# Patient Record
Sex: Male | Born: 1945 | Race: White | Hispanic: No | Marital: Married | Smoking: Former smoker
Health system: Southern US, Community
[De-identification: ages and names within clinical notes are randomized; demographics above are authoritative.]

## PROBLEM LIST (undated history)

## (undated) DIAGNOSIS — I2510 Atherosclerotic heart disease of native coronary artery without angina pectoris: Secondary | ICD-10-CM

## (undated) DIAGNOSIS — K219 Gastro-esophageal reflux disease without esophagitis: Secondary | ICD-10-CM

## (undated) DIAGNOSIS — F5221 Male erectile disorder: Secondary | ICD-10-CM

## (undated) DIAGNOSIS — I509 Heart failure, unspecified: Secondary | ICD-10-CM

## (undated) DIAGNOSIS — I10 Essential (primary) hypertension: Secondary | ICD-10-CM

## (undated) DIAGNOSIS — I69320 Aphasia following cerebral infarction: Secondary | ICD-10-CM

## (undated) DIAGNOSIS — S065X9A Traumatic subdural hemorrhage with loss of consciousness of unspecified duration, initial encounter: Secondary | ICD-10-CM

## (undated) DIAGNOSIS — I480 Paroxysmal atrial fibrillation: Secondary | ICD-10-CM

## (undated) DIAGNOSIS — Z950 Presence of cardiac pacemaker: Secondary | ICD-10-CM

## (undated) DIAGNOSIS — E785 Hyperlipidemia, unspecified: Secondary | ICD-10-CM

## (undated) DIAGNOSIS — I639 Cerebral infarction, unspecified: Secondary | ICD-10-CM

## (undated) DIAGNOSIS — S065XAA Traumatic subdural hemorrhage with loss of consciousness status unknown, initial encounter: Secondary | ICD-10-CM

## (undated) DIAGNOSIS — D62 Acute posthemorrhagic anemia: Secondary | ICD-10-CM

## (undated) DIAGNOSIS — Z95818 Presence of other cardiac implants and grafts: Secondary | ICD-10-CM

## (undated) DIAGNOSIS — F418 Other specified anxiety disorders: Secondary | ICD-10-CM

## (undated) DIAGNOSIS — I495 Sick sinus syndrome: Secondary | ICD-10-CM

## (undated) DIAGNOSIS — R531 Weakness: Secondary | ICD-10-CM

## (undated) DIAGNOSIS — Z974 Presence of external hearing-aid: Secondary | ICD-10-CM

## (undated) HISTORY — PX: CATARACT EXTRACTION: SUR2

## (undated) HISTORY — PX: HEMORROIDECTOMY: SUR656

## (undated) HISTORY — PX: CHOLECYSTECTOMY: SHX55

## (undated) HISTORY — PX: UPPER GI ENDOSCOPY: SHX6162

---

## 1994-09-03 DIAGNOSIS — I219 Acute myocardial infarction, unspecified: Secondary | ICD-10-CM

## 1994-09-03 HISTORY — DX: Acute myocardial infarction, unspecified: I21.9

## 2004-10-04 ENCOUNTER — Emergency Department: Payer: Self-pay | Admitting: Emergency Medicine

## 2005-02-08 ENCOUNTER — Other Ambulatory Visit: Payer: Self-pay

## 2005-02-08 ENCOUNTER — Emergency Department: Payer: Self-pay | Admitting: General Practice

## 2005-02-09 ENCOUNTER — Ambulatory Visit: Payer: Self-pay | Admitting: General Practice

## 2005-04-04 ENCOUNTER — Ambulatory Visit: Payer: Self-pay | Admitting: Gastroenterology

## 2005-04-20 ENCOUNTER — Ambulatory Visit: Payer: Self-pay | Admitting: Surgery

## 2005-07-17 ENCOUNTER — Ambulatory Visit: Payer: Self-pay | Admitting: Gastroenterology

## 2006-07-03 ENCOUNTER — Other Ambulatory Visit: Payer: Self-pay

## 2006-07-03 ENCOUNTER — Emergency Department: Payer: Self-pay | Admitting: Internal Medicine

## 2006-08-19 IMAGING — NM NUCLEAR MEDICINE HEPATOHBILIARY INCLUDE GB
1 series · 6 of 6 positions shown · non-contrast
Comparison: none

REASON FOR EXAM: Abdominal pain. If neg US do HIDA with CCK
COMMENTS:

[Series 0: cck hepato · 3.9mm · 3.90mm/px · 6 of 60 frames shown]
[frame 6/60]
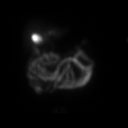
[frame 16/60]
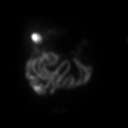
[frame 26/60]
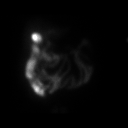
[frame 36/60]
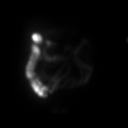
[frame 46/60]
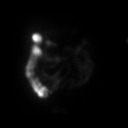
[frame 56/60]
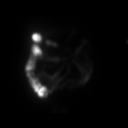

[6 of 6 positions shown; findings below may reference images not displayed]

PROCEDURE:     NM  - NM HEPATO WITH GB EJECT FRACTION  - April 04, 2005 [DATE]

RESULT:     Following injection of 8.11 millicuries of technetium-22m
Choletec, there is noted prompt visualization of tracer activity in the
liver at three minutes. Tracer activity is seen in the gallbladder, common
duct, and proximal small bowel at 30 minutes.

The gallbladder ejection fraction at 30 minutes measures 2% which is in the
hypokinetic range.
IMPRESSION: Normal hepatobiliary scan.

The gallbladder ejection fraction measures 2% which is below the normal
range and consistent with hypokinesis or akinesis.

## 2006-08-27 ENCOUNTER — Emergency Department: Payer: Self-pay | Admitting: Emergency Medicine

## 2006-08-27 ENCOUNTER — Other Ambulatory Visit: Payer: Self-pay

## 2006-11-13 ENCOUNTER — Other Ambulatory Visit: Payer: Self-pay

## 2006-11-13 ENCOUNTER — Emergency Department: Payer: Self-pay | Admitting: Emergency Medicine

## 2007-10-27 ENCOUNTER — Ambulatory Visit: Payer: Self-pay | Admitting: Family Medicine

## 2007-11-17 IMAGING — CR DG CHEST 2V
1 series · 2 of 2 positions shown · non-contrast
Comparison: none

REASON FOR EXAM: Chest pain
COMMENTS:

PROCEDURE:     DXR - DXR CHEST PA (OR AP) AND LATERAL  - July 03, 2006 [DATE]
RESULT:     Comparison is made to a prior study dated 02/08/2005.
The lungs are clear. The cardiac silhouette and visualized bony skeleton are
unremarkable.

[Series 1: view not recorded · 0.17mm/px · 2 of 2 slices shown]
[im 1/2]
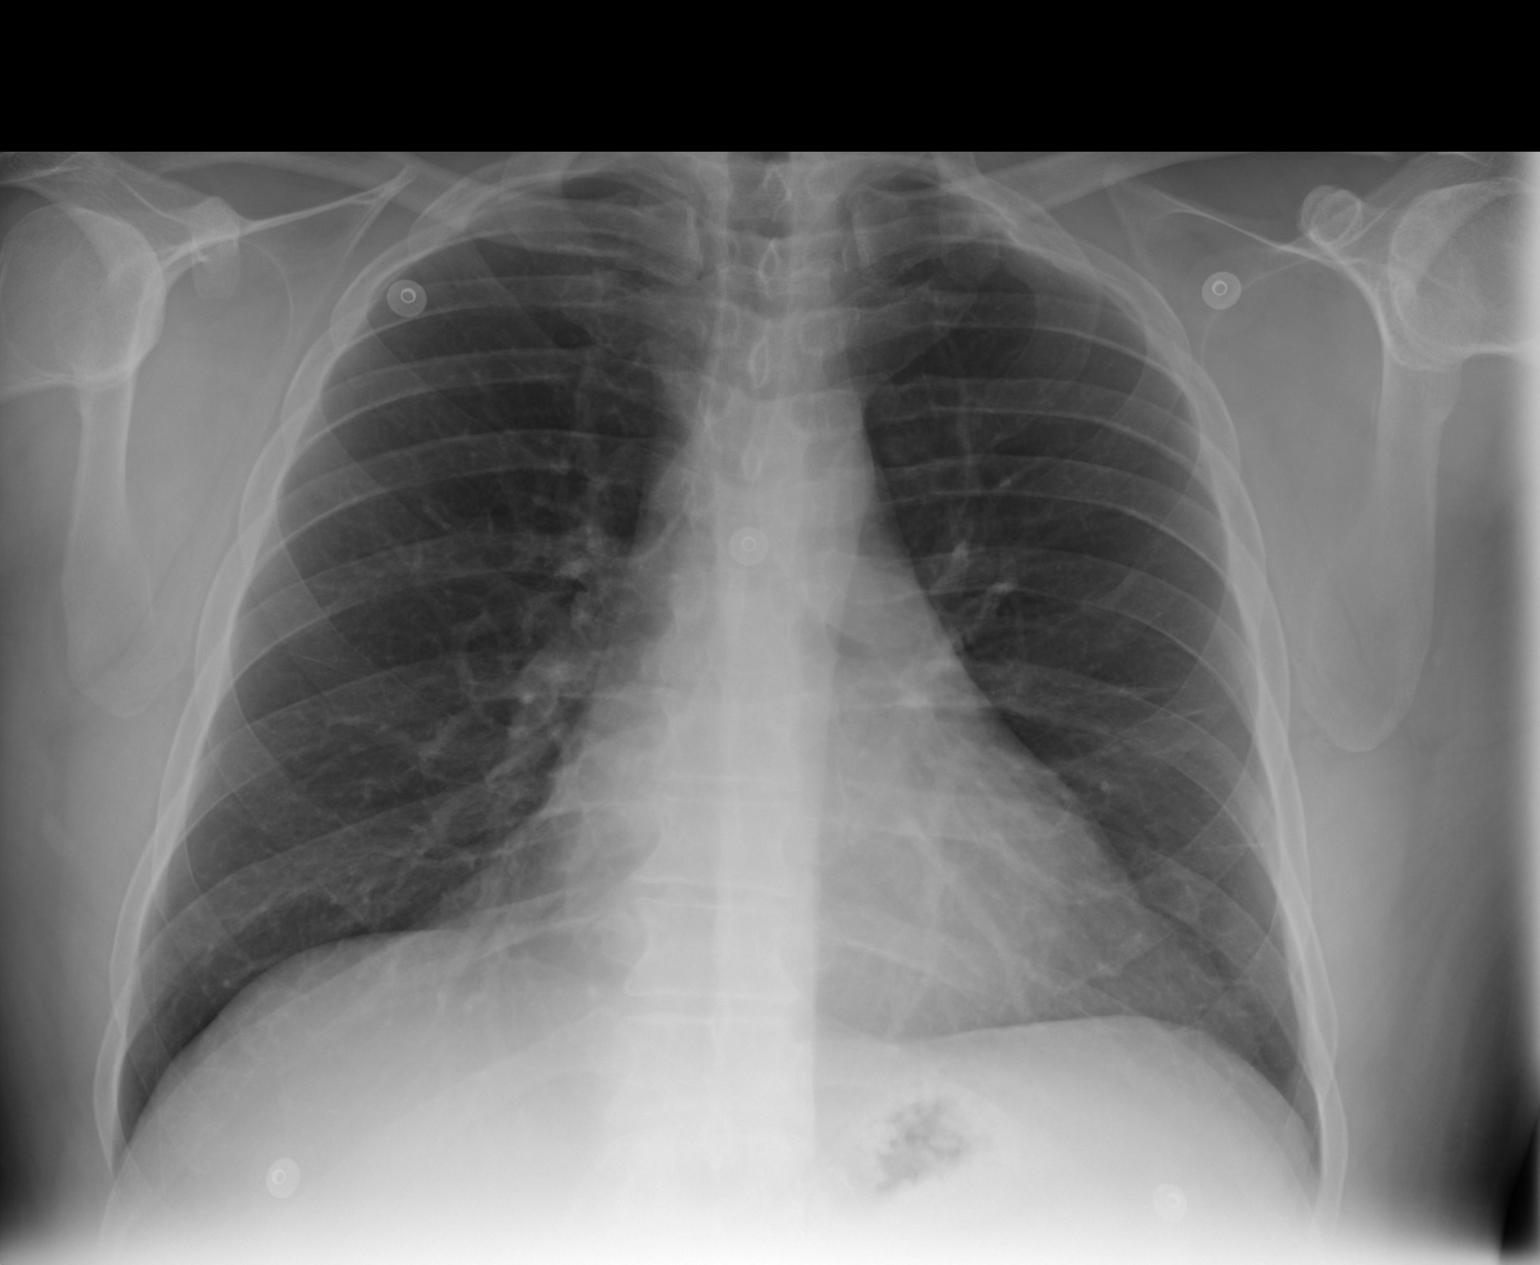
[im 2/2]
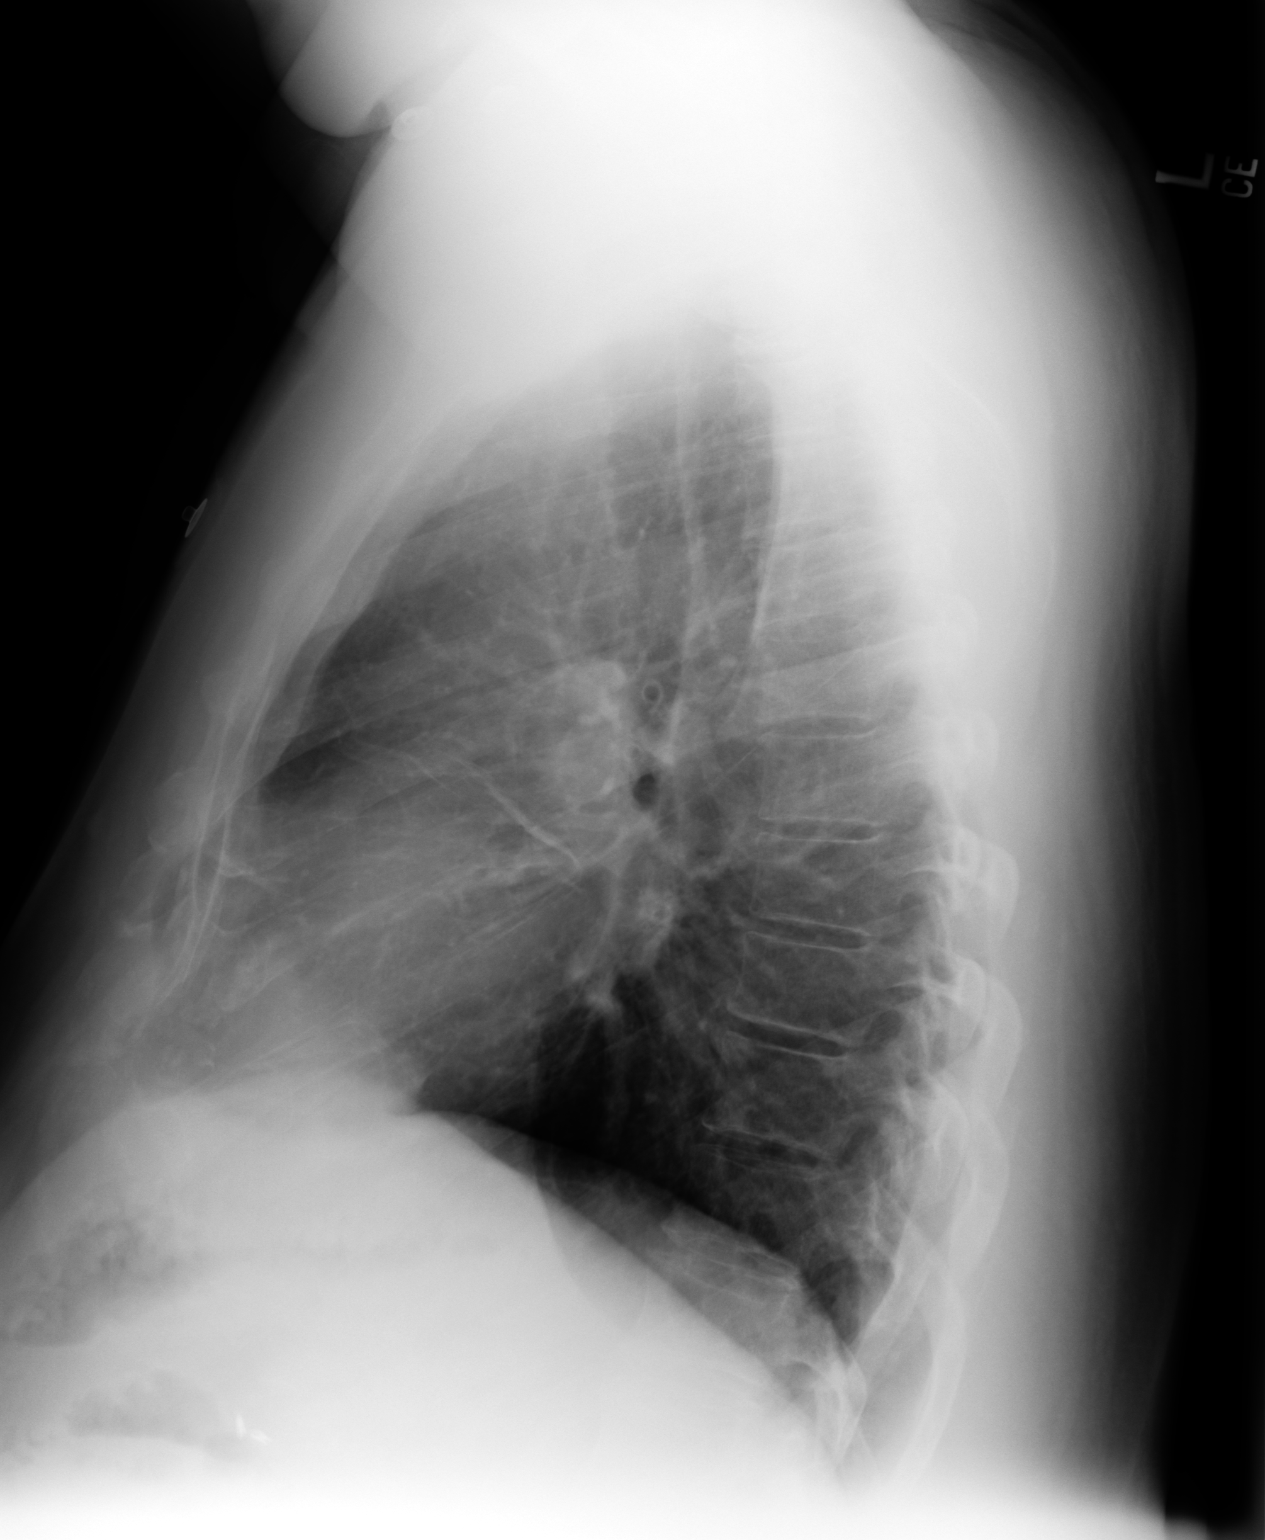

[2 of 2 positions shown; findings below may reference images not displayed]

IMPRESSION: Chest radiograph without evidence of acute cardiopulmonary
disease.

## 2007-11-23 ENCOUNTER — Emergency Department: Payer: Self-pay | Admitting: Emergency Medicine

## 2007-12-25 ENCOUNTER — Emergency Department: Payer: Self-pay | Admitting: Emergency Medicine

## 2007-12-26 ENCOUNTER — Other Ambulatory Visit: Payer: Self-pay

## 2007-12-26 ENCOUNTER — Inpatient Hospital Stay: Payer: Self-pay | Admitting: Internal Medicine

## 2007-12-26 ENCOUNTER — Ambulatory Visit: Payer: Self-pay | Admitting: Family Medicine

## 2008-01-11 IMAGING — CR DG ABDOMEN 3V
1 series · 5 of 5 positions shown · non-contrast
Comparison: none

REASON FOR EXAM: ABDOMINAL PAIN
COMMENTS:

[Series 1: view not recorded · 0.17mm/px · 5 of 5 slices shown]
[im 1/5]
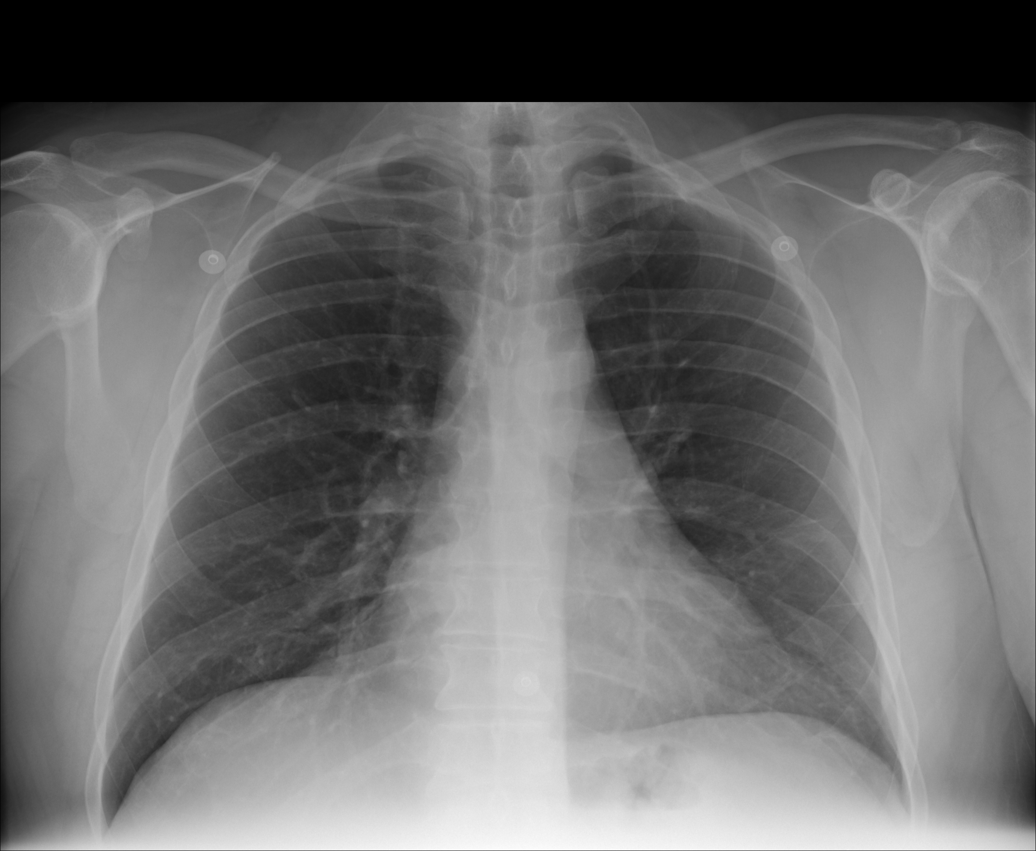
[im 2/5]
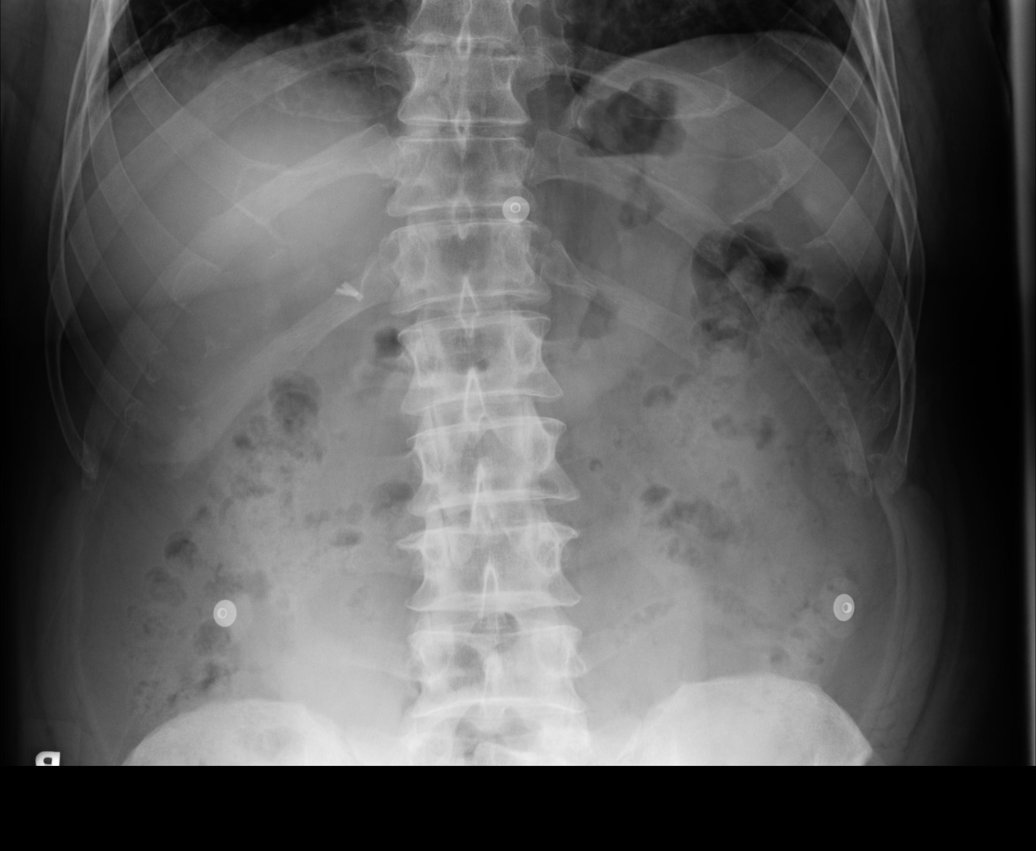
[im 3/5]
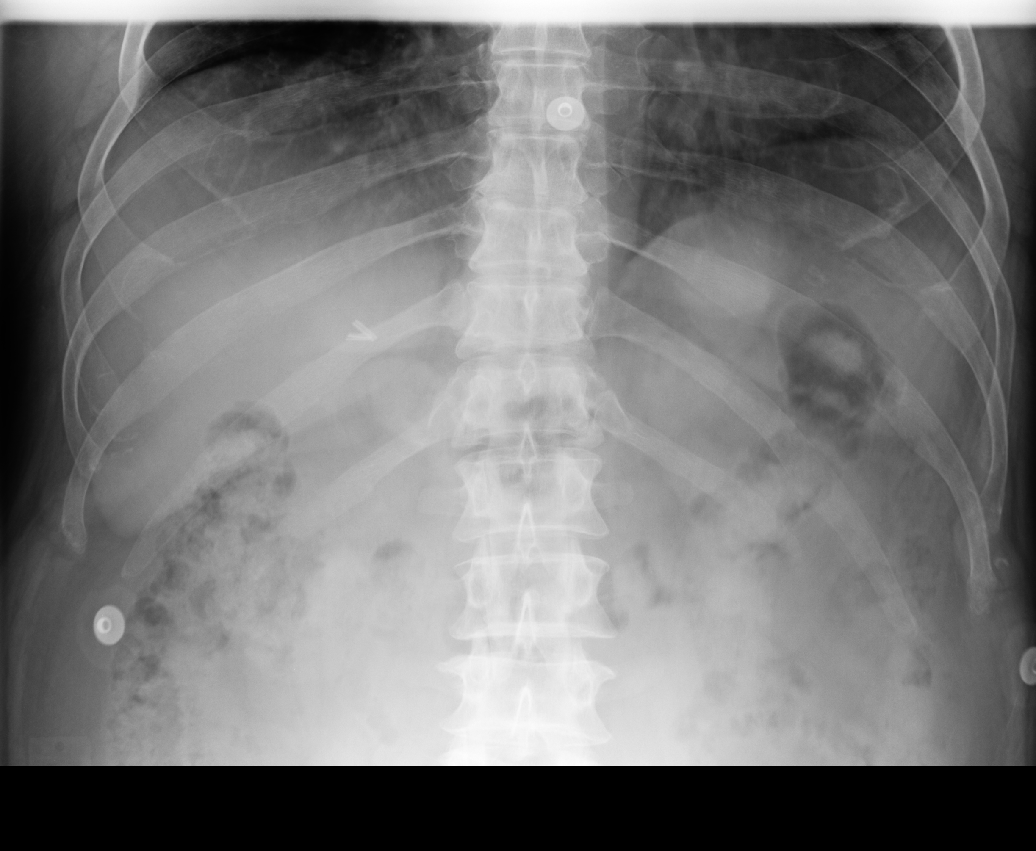
[im 4/5]
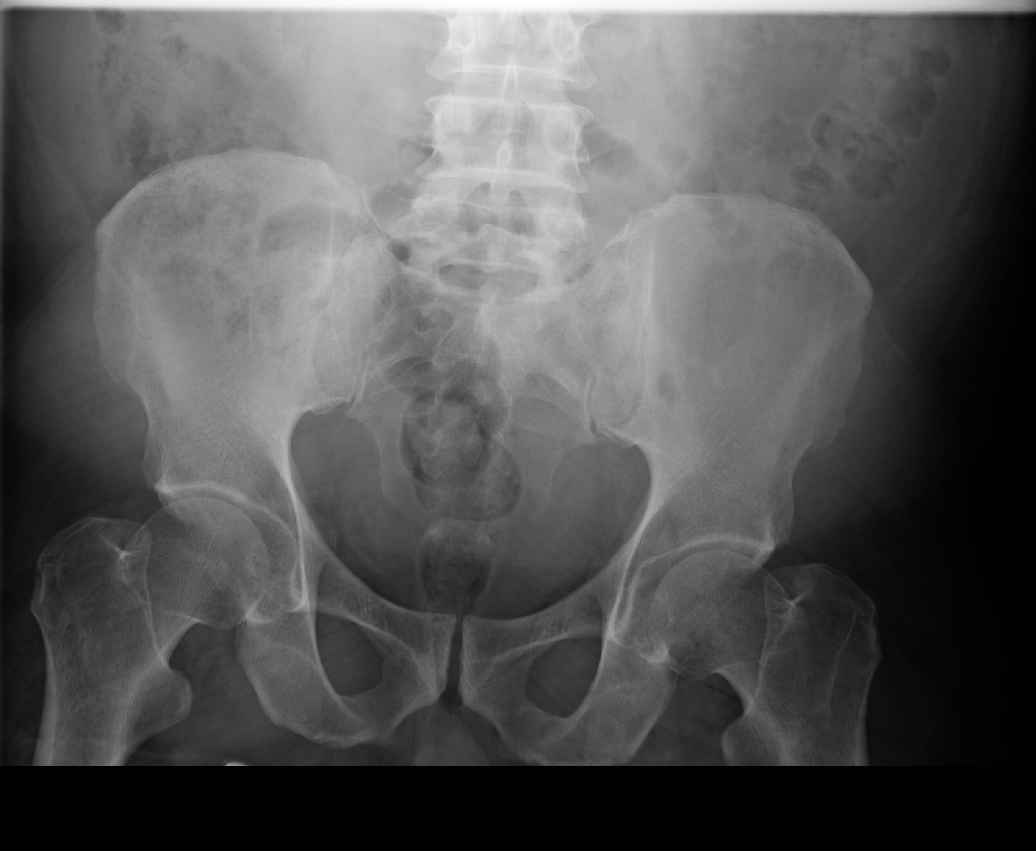
[im 5/5]
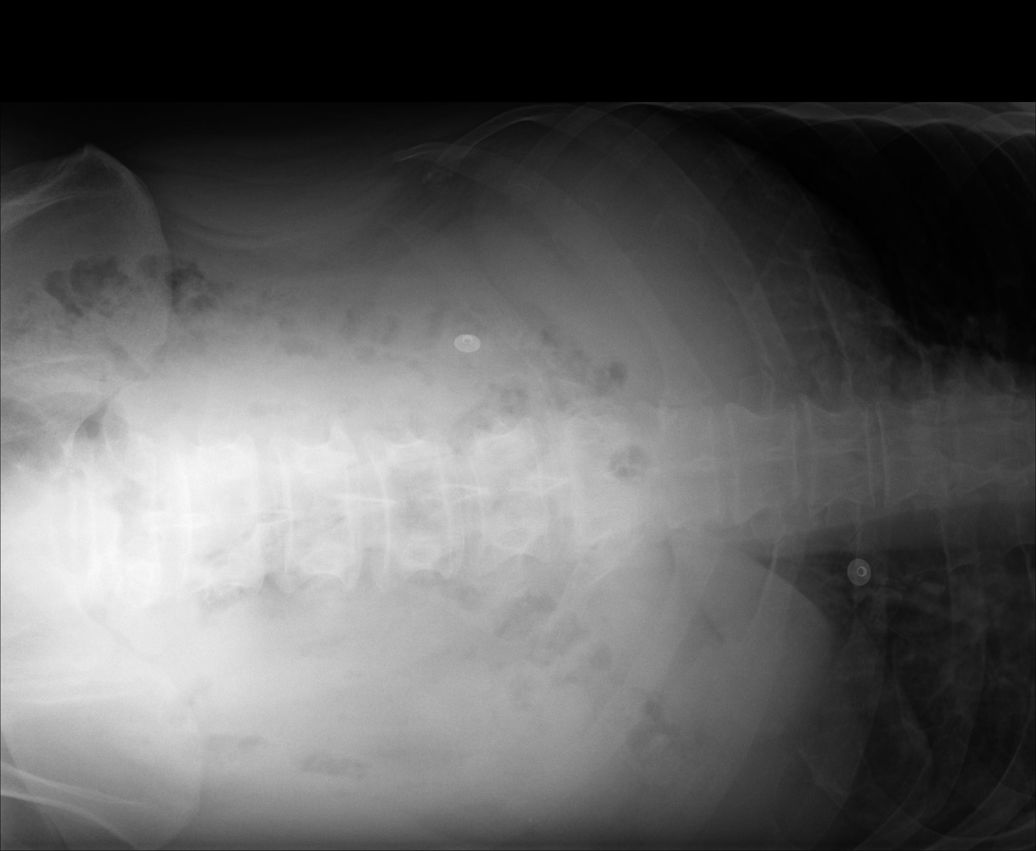

[5 of 5 positions shown; findings below may reference images not displayed]

PROCEDURE:     DXR - DXR ABDOMEN 3-WAY (INCL PA CXR)  - August 27, 2006  [DATE]

RESULT:     The patient is complaining of abdominal discomfort.

The lungs are adequately inflated and clear. The heart and pulmonary
vascularity are normal in appearance. The abdominal films reveal a
relatively nonspecific bowel gas pattern that may indicate constipation.
There is no evidence of obstruction or ileus. The gallbladder is surgically
absent. I see no acute bony abnormality. No free extraluminal gas is seen.
IMPRESSION: 1.     The bowel gas pattern is normal to nonspecific. I cannot exclude
constipation.
2.     I do not see evidence of acute cardiopulmonary disease.

## 2008-02-19 ENCOUNTER — Ambulatory Visit: Payer: Self-pay | Admitting: Unknown Physician Specialty

## 2008-03-29 IMAGING — CR DG CHEST 1V PORT
1 series · 1 of 1 positions shown · non-contrast
Comparison: none

REASON FOR EXAM: Chest Pain
COMMENTS:

PROCEDURE:     DXR - DXR PORTABLE CHEST SINGLE VIEW  - November 13, 2006  [DATE]
RESULT:     The current exam is compared to a prior examination of
07/03/2006. The lung fields are clear. No acute changes of the heart,
mediastinal or osseous structures are noted. Monitoring electrodes are
present.

[view not recorded]
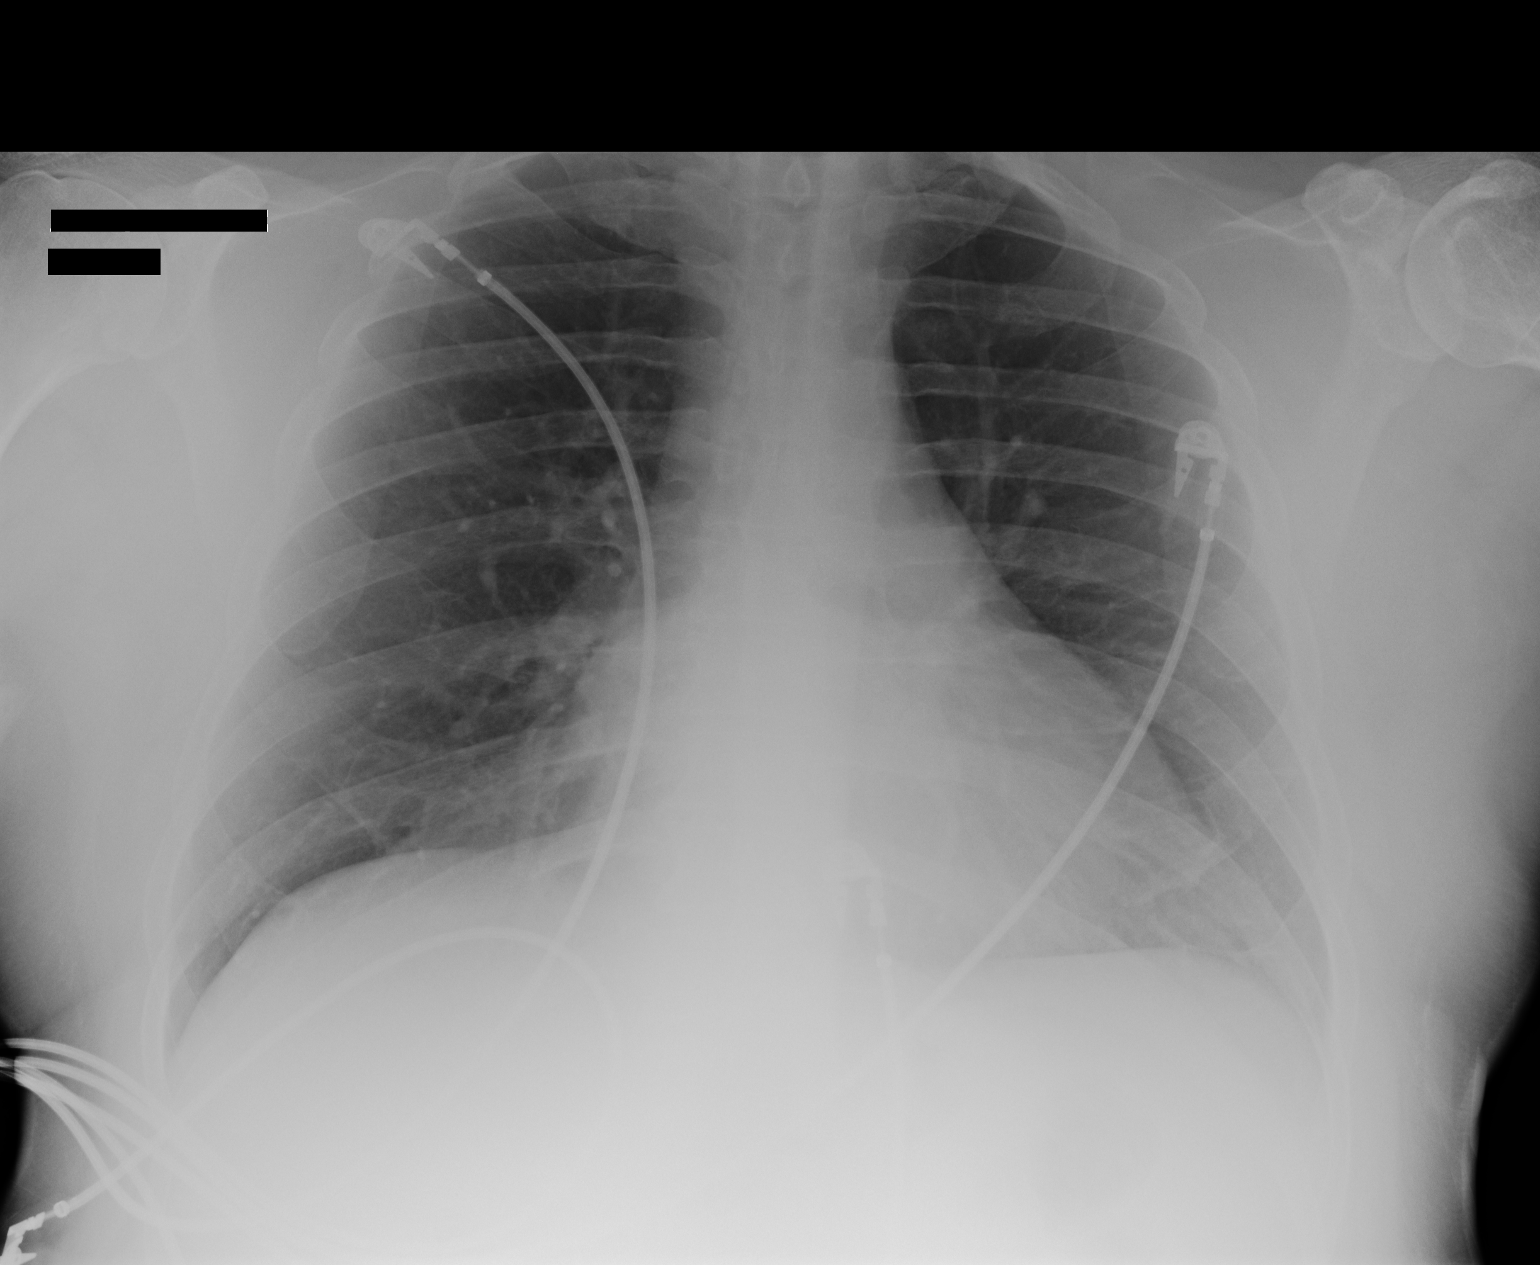

[1 of 1 positions shown; findings below may reference images not displayed]

IMPRESSION: No acute changes are identified.

## 2009-06-26 ENCOUNTER — Ambulatory Visit: Payer: Self-pay | Admitting: Internal Medicine

## 2012-06-11 ENCOUNTER — Ambulatory Visit: Payer: Self-pay | Admitting: Ophthalmology

## 2013-11-07 ENCOUNTER — Ambulatory Visit: Payer: Self-pay | Admitting: Internal Medicine

## 2014-04-14 ENCOUNTER — Ambulatory Visit: Payer: Self-pay | Admitting: Nurse Practitioner

## 2015-08-29 IMAGING — CT CT HEAD WITHOUT CONTRAST
1 series · 15 of 30 positions shown, 19 images · non-contrast
Comparison: None.

CLINICAL DATA: Headache secondary to head trauma secondary to a
fall on 04/13/2014 with trauma to the posterior aspect of the right
side of the skull.

EXAM:
CT HEAD WITHOUT CONTRAST
TECHNIQUE: Contiguous axial images were obtained from the base of the skull
through the vertex without intravenous contrast.

[Series 2: head wo · axial · 0.42mm/px · z∈[-34,+106]mm · 15 of 32 slices shown, 19 images]
[im 2/32  brain]
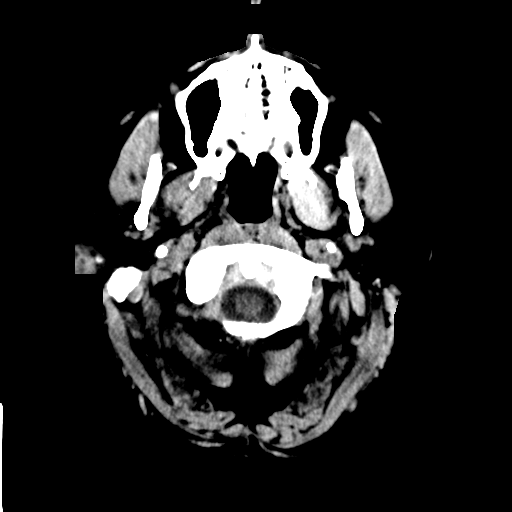
[im 2/32  bone]
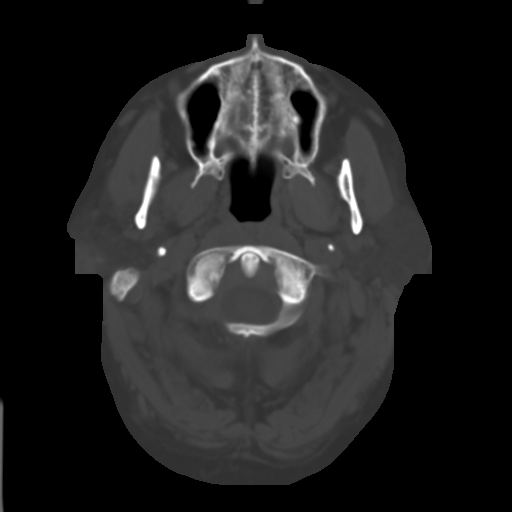
[im 4/32  brain]
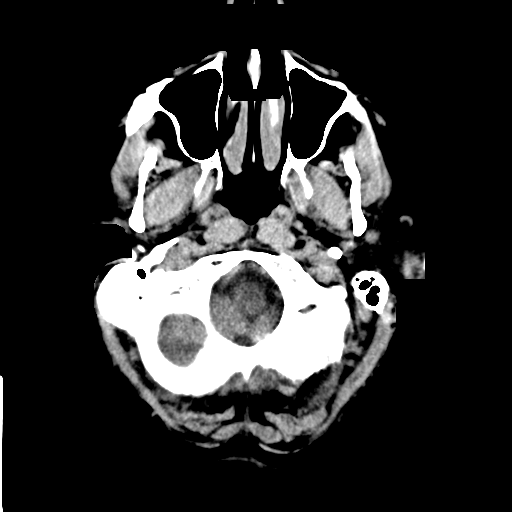
[im 6/32  brain]
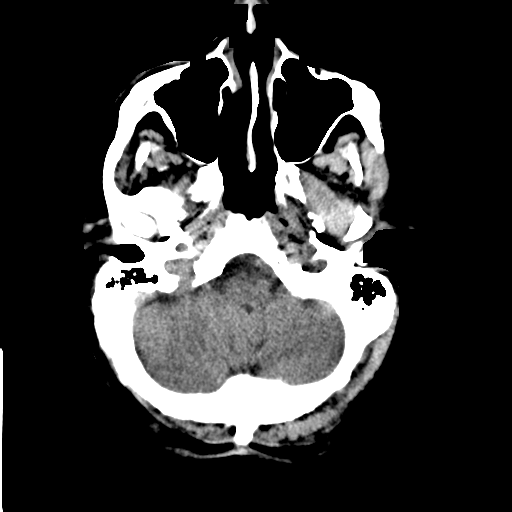
[im 8/32  brain]
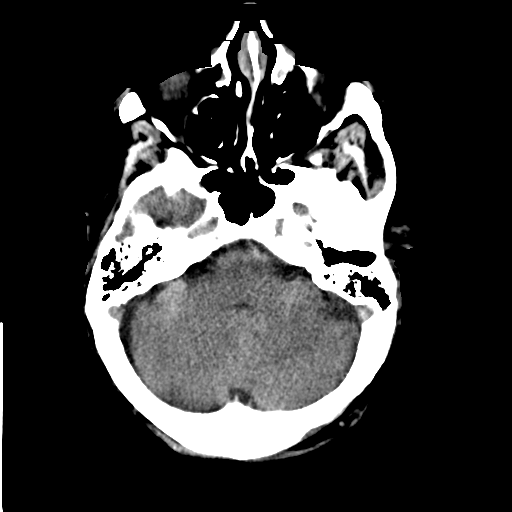
[im 10/32  brain]
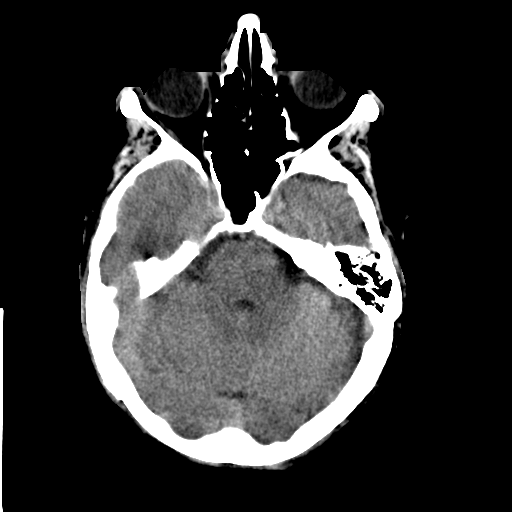
[im 10/32  bone]
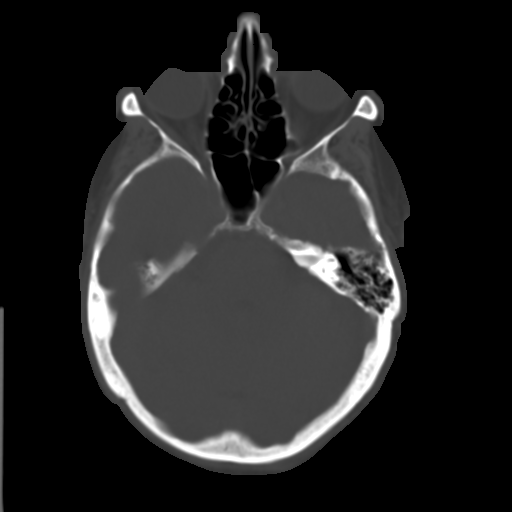
[im 12/32  brain]
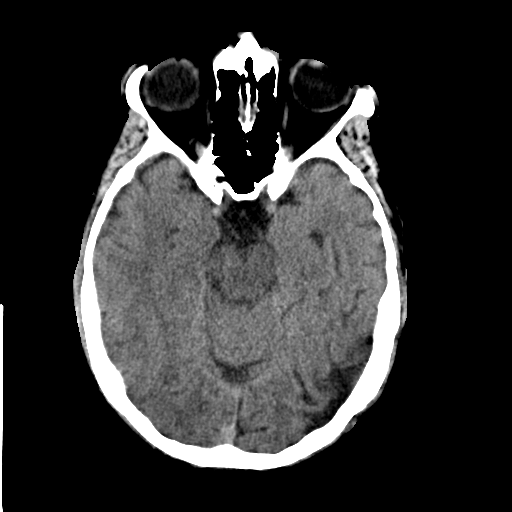
[im 14/32  brain]
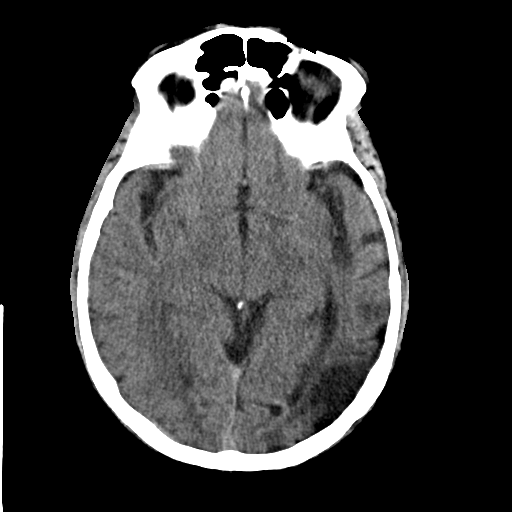
[im 17/32  brain]
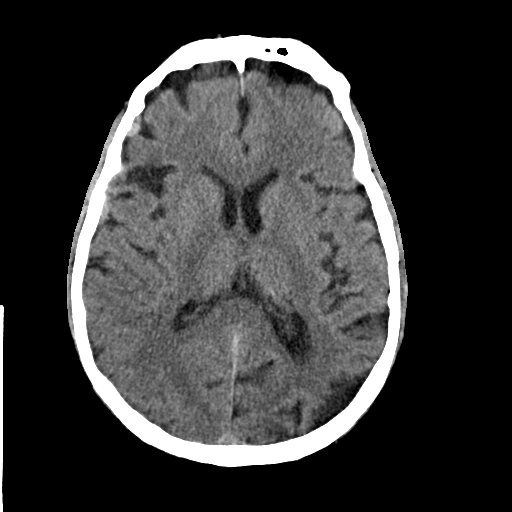
[im 18/32  brain]
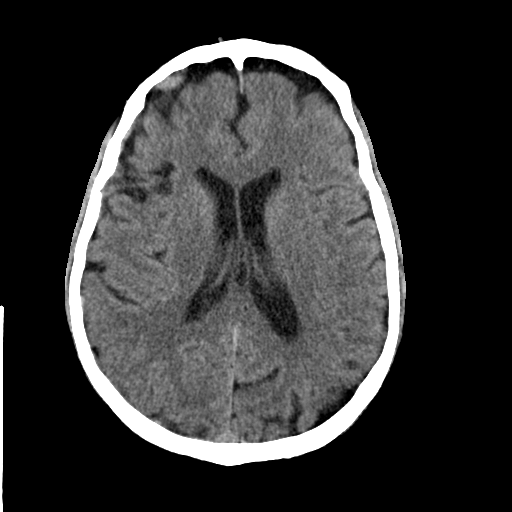
[im 18/32  bone]
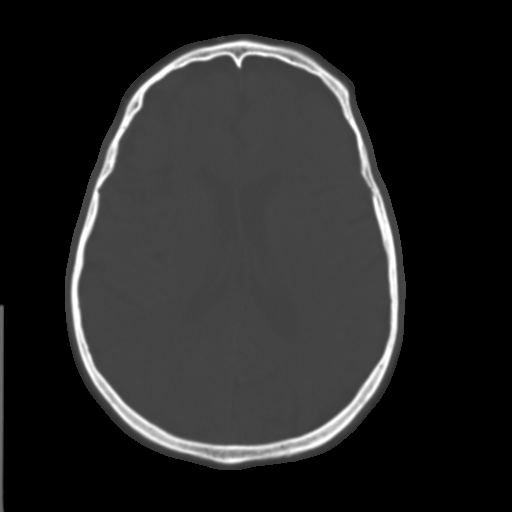
[im 20/32  brain]
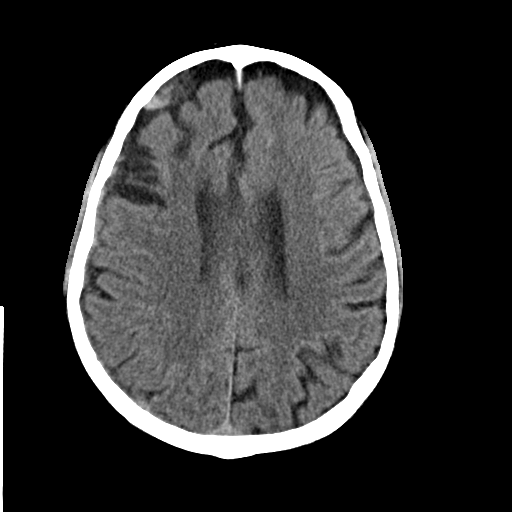
[im 22/32  brain]
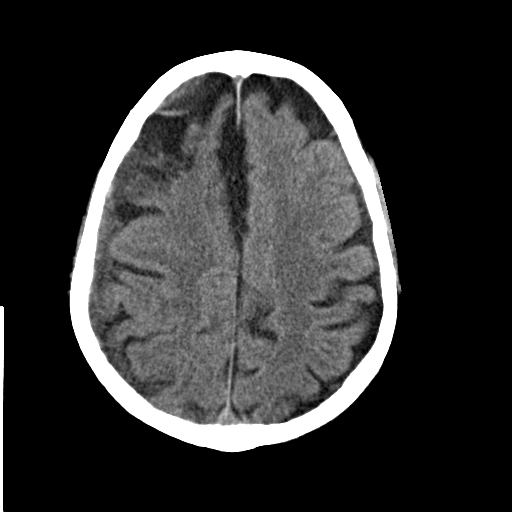
[im 24/32  brain]
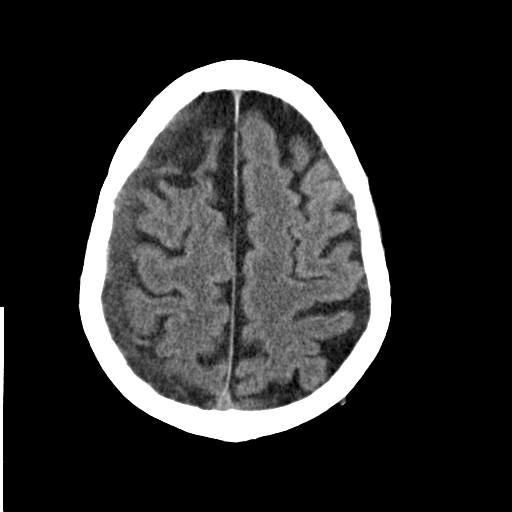
[im 26/32  brain]
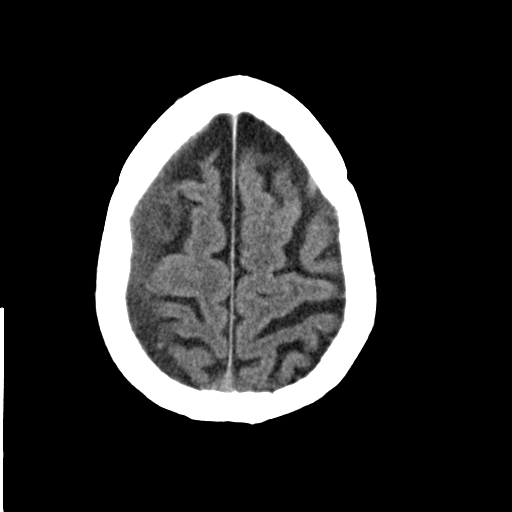
[im 26/32  bone]
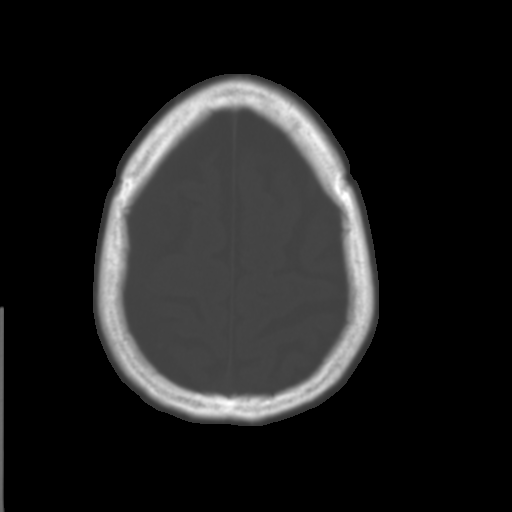
[im 28/32  brain]
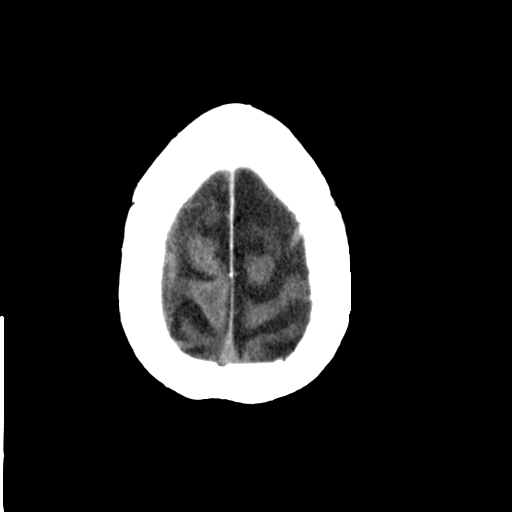
[im 30/32  brain]
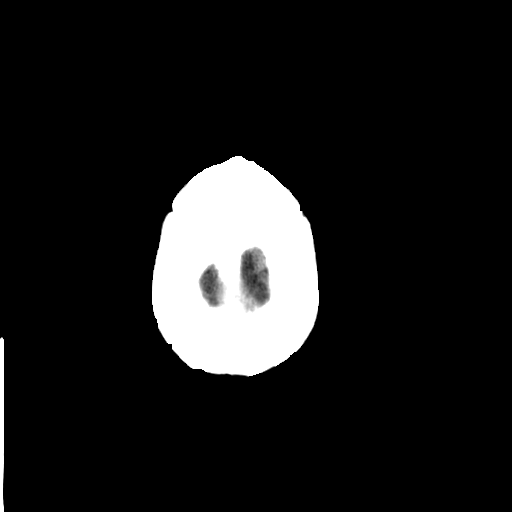

[15 of 30 positions shown; findings below may reference images not displayed]

FINDINGS: The patient has an acute on chronic right subdural hematoma. There
are several small areas of acute hemorrhage within the chronic
subdural hematoma, primarily along the frontal component although
there is some acute hemorrhage in the right temporal fossa and
possibly high over the right frontoparietal region.

There is no midline shift. There is a slight mass effect by the
chronic component of the subdural hematoma over the superior aspect
of the right cerebral hemisphere. The patient has diffuse cerebral
cortical atrophy. There are old right frontal and left occipital
infarcts with secondary encephalomalacia.
IMPRESSION: 1. Acute on chronic right subdural hematoma with slight mass effect
but no midline shift.
2. Diffuse cerebral cortical atrophy.
3. Old left occipital and right frontal infarcts.
Critical Value/emergent results were called by telephone at the time
of interpretation on 04/14/2014 at [DATE] to Tiger, Es
nurse, who verbally acknowledged these results.

## 2015-12-12 ENCOUNTER — Ambulatory Visit
Admission: EM | Admit: 2015-12-12 | Discharge: 2015-12-12 | Disposition: A | Discharge status: Home / Self Care | Payer: Medicare Other | Attending: Family Medicine | Admitting: Family Medicine

## 2015-12-12 ENCOUNTER — Encounter: Payer: Self-pay | Admitting: Gynecology

## 2015-12-12 ENCOUNTER — Ambulatory Visit: Payer: Medicare Other

## 2015-12-12 DIAGNOSIS — Z87891 Personal history of nicotine dependence: Secondary | ICD-10-CM | POA: Insufficient documentation

## 2015-12-12 DIAGNOSIS — Z7982 Long term (current) use of aspirin: Secondary | ICD-10-CM | POA: Insufficient documentation

## 2015-12-12 DIAGNOSIS — Z8673 Personal history of transient ischemic attack (TIA), and cerebral infarction without residual deficits: Secondary | ICD-10-CM | POA: Diagnosis not present

## 2015-12-12 DIAGNOSIS — I10 Essential (primary) hypertension: Secondary | ICD-10-CM | POA: Insufficient documentation

## 2015-12-12 DIAGNOSIS — I480 Paroxysmal atrial fibrillation: Secondary | ICD-10-CM | POA: Insufficient documentation

## 2015-12-12 DIAGNOSIS — F0390 Unspecified dementia without behavioral disturbance: Secondary | ICD-10-CM | POA: Diagnosis not present

## 2015-12-12 DIAGNOSIS — I2510 Atherosclerotic heart disease of native coronary artery without angina pectoris: Secondary | ICD-10-CM | POA: Insufficient documentation

## 2015-12-12 DIAGNOSIS — R51 Headache: Secondary | ICD-10-CM | POA: Diagnosis present

## 2015-12-12 DIAGNOSIS — J111 Influenza due to unidentified influenza virus with other respiratory manifestations: Secondary | ICD-10-CM | POA: Insufficient documentation

## 2015-12-12 DIAGNOSIS — K219 Gastro-esophageal reflux disease without esophagitis: Secondary | ICD-10-CM | POA: Diagnosis not present

## 2015-12-12 DIAGNOSIS — Z79899 Other long term (current) drug therapy: Secondary | ICD-10-CM | POA: Insufficient documentation

## 2015-12-12 DIAGNOSIS — F419 Anxiety disorder, unspecified: Secondary | ICD-10-CM | POA: Insufficient documentation

## 2015-12-12 DIAGNOSIS — R05 Cough: Secondary | ICD-10-CM | POA: Diagnosis present

## 2015-12-12 DIAGNOSIS — E785 Hyperlipidemia, unspecified: Secondary | ICD-10-CM | POA: Insufficient documentation

## 2015-12-12 HISTORY — DX: Essential (primary) hypertension: I10

## 2015-12-12 HISTORY — DX: Sick sinus syndrome: I49.5

## 2015-12-12 HISTORY — DX: Paroxysmal atrial fibrillation: I48.0

## 2015-12-12 HISTORY — DX: Hyperlipidemia, unspecified: E78.5

## 2015-12-12 HISTORY — DX: Atherosclerotic heart disease of native coronary artery without angina pectoris: I25.10

## 2015-12-12 HISTORY — DX: Other specified anxiety disorders: F41.8

## 2015-12-12 HISTORY — DX: Acute posthemorrhagic anemia: D62

## 2015-12-12 HISTORY — DX: Male erectile disorder: F52.21

## 2015-12-12 HISTORY — DX: Gastro-esophageal reflux disease without esophagitis: K21.9

## 2015-12-12 HISTORY — DX: Cerebral infarction, unspecified: I63.9

## 2015-12-12 HISTORY — DX: Traumatic subdural hemorrhage with loss of consciousness status unknown, initial encounter: S06.5XAA

## 2015-12-12 HISTORY — DX: Traumatic subdural hemorrhage with loss of consciousness of unspecified duration, initial encounter: S06.5X9A

## 2015-12-12 HISTORY — DX: Presence of cardiac pacemaker: Z95.0

## 2015-12-12 LAB — RAPID INFLUENZA A&B ANTIGENS
Influenza A (ARMC): NEGATIVE
Influenza B (ARMC): NEGATIVE

## 2015-12-12 MED ORDER — ALBUTEROL SULFATE HFA 108 (90 BASE) MCG/ACT IN AERS: 2.0000 | INHALATION_SPRAY | Freq: Four times a day (QID) | RESPIRATORY_TRACT | Status: DC | PRN

## 2015-12-12 MED ORDER — AZITHROMYCIN 250 MG PO TABS: ORAL_TABLET | ORAL | Status: DC

## 2015-12-12 MED ORDER — BENZONATATE 100 MG PO CAPS: 100.0000 mg | ORAL_CAPSULE | Freq: Three times a day (TID) | ORAL | Status: DC

## 2015-12-12 NOTE — ED Notes (Signed)
Patient c/o x 1 week coughing / sinus problem. Patient concern about Pneunomia s/p 6 months ago.

## 2015-12-12 NOTE — ED Provider Notes (Signed)
CSN: [id number]     Arrival date & time 12/12/15  1707 History   First MD Initiated Contact with Patient 12/12/15 1753    Nurses notes were reviewed. Chief Complaint  Patient presents with  . Cough  . Facial Pain   Patient reports having a cough some facial pain. He has a history of some dementia and altered mental status which we found out later. His history was somewhat confusing to his daughter basically contributed information. There is some confusion as far as what medicine he was allergic to when he could not take which would probably gastric now. Apparently he was in [place] last week when Monday and Tuesday he became sick Wednesday and Thursday he had fever. The cough was productive earlier in the week and the sputum was grayish green but that has become less productive and is now white but he's had more shortness of breath and the cough has become more intense since then as well. He's had his flu vaccination but that was back in October. His daughter works in [place] to help drive home with him and his wife to get him back to [place]. She was concerned because in November he had pneumonia and a similar presentation which finally cleared once they placed him on a Z-Pak. [facility] November. He still has some facial pain with myalgia has gotten better. His grandson was sick when he got up in [place] and sounds like his grandson had the flu. His arm is very concerned he may have pneumonia again. He's had subdural hematomas before strokes he has coronary artery disease A. fib hypertension hyperlipidemia. He's has gallbladder and him was extracted for the past. He is a former smoker. No significant family history available for this visit.    (Consider location/radiation/quality/duration/timing/severity/associated sxs/prior Treatment) Patient is a 70 y.o. male presenting with cough. The history is provided by the patient. No language interpreter was  used.  Cough Cough characteristics:  Productive and non-productive Sputum characteristics:  White and gray Severity:  Moderate Onset quality:  Sudden Timing:  Constant Progression:  Waxing and waning Chronicity:  New Context: upper respiratory infection   Relieved by:  Nothing Worsened by:  Nothing tried Associated symptoms: chills, fever, rhinorrhea, shortness of breath and sinus congestion   Risk factors: no recent infection     Past Medical History  Diagnosis Date  . Acute blood loss anemia   . Mixed anxiety depressive disorder   . Impotence of non-organic origin   . Gastroesophageal reflux disease   . Sinus node dysfunction (HCC)   . Pacemaker   . Subdural hematoma (HCC)   . Stroke (HCC)   . CAD (coronary artery disease)   . Paroxysmal a-fib (HCC)   . Hypertension   . Hyperlipemia    Past Surgical History  Procedure Laterality Date  . Cholecystectomy    . Hemorroidectomy    . Cataract extraction    . Upper gi endoscopy     No family history on file. Social History  Substance Use Topics  . Smoking status: Former Games developermoker  . Smokeless tobacco: None  . Alcohol Use: No    Review of Systems  Unable to perform ROS: Dementia  Constitutional: Positive for fever and chills.  HENT: Positive for rhinorrhea.   Respiratory: Positive for cough and shortness of breath.   All other systems reviewed and are negative.   Allergies  Erythromycin base; Levofloxacin; Lorazepam; and Oxycodone  Home Medications  Prior to Admission medications   Medication Sig Start Date End Date Taking? Authorizing Provider  aspirin 81 MG tablet Take 81 mg by mouth daily.   Yes Historical Provider, MD  FLUoxetine (PROZAC) 40 MG capsule Take 40 mg by mouth daily.   Yes Historical Provider, MD  latanoprost (XALATAN) 0.005 % ophthalmic solution 1 drop at bedtime.   Yes Historical Provider, MD  metoprolol succinate (TOPROL-XL) 100 MG 24 hr tablet Take 100 mg by mouth daily. Take with or  immediately following a meal.   Yes Historical Provider, MD  omeprazole (PRILOSEC) 20 MG capsule Take 20 mg by mouth daily.   Yes Historical Provider, MD  simvastatin (ZOCOR) 20 MG tablet Take 20 mg by mouth daily.   Yes Historical Provider, MD  albuterol (PROVENTIL HFA;VENTOLIN HFA) 108 (90 Base) MCG/ACT inhaler Inhale 2 puffs into the lungs every 6 (six) hours as needed for wheezing or shortness of breath. 12/12/15   Hassan Rowan, MD  azithromycin (ZITHROMAX Z-PAK) 250 MG tablet Take 2 tablets first day and then 1 po a day for 4 days 12/12/15   Hassan Rowan, MD  benzonatate (TESSALON) 100 MG capsule Take 1 capsule (100 mg total) by mouth every 8 (eight) hours. 12/12/15   Hassan Rowan, MD   Meds Ordered and Administered this Visit  Medications - No data to display  BP 142/69 mmHg  Pulse 63  Temp(Src) 98 F (36.7 C) (Oral)  Resp 16  Ht  (1.651 m)  Wt 195 lb (88.451 kg)  BMI 32.45 kg/m2  SpO2 100% No data found.   Physical Exam  Constitutional: He is oriented to person, place, and time. He appears well-developed and well-nourished.  HENT:  Head: Normocephalic and atraumatic.  Eyes: Conjunctivae are normal. Pupils are equal, round, and reactive to light.  Neck: Normal range of motion. Neck supple. No tracheal deviation present.  Cardiovascular: Normal rate and regular rhythm.   Pulmonary/Chest: Effort normal and breath sounds normal.  Musculoskeletal: Normal range of motion. He exhibits no tenderness.  Lymphadenopathy:    He has cervical adenopathy.  Neurological: He is alert and oriented to person, place, and time.  Skin: Skin is warm and dry.  Psychiatric: He has a normal mood and affect.  Vitals reviewed.   ED Course  Procedures (including critical care time)  Labs Review Labs Reviewed  RAPID INFLUENZA A&B ANTIGENS [facility] ONLY)    Imaging Review Dg Chest 2 View  12/12/2015  CLINICAL DATA:  Productive cough, fever and fatigue for 1 week. EXAM: CHEST  2 VIEW COMPARISON:   12/26/2007 FINDINGS: The heart size and mediastinal contours are within normal limits. The pacer wires are in good position without complicating features. Both lungs are clear. The visualized skeletal structures are unremarkable. IMPRESSION: No acute cardiopulmonary findings. Electronically Signed   By: Rudie Meyer M.D.   On: 12/12/2015 19:12     Visual Acuity Review  Right Eye Distance:   Left Eye Distance:   Bilateral Distance:    Right Eye Near:   Left Eye Near:    Bilateral Near:      Results for orders placed or performed during the hospital encounter of 12/12/15  Rapid Influenza A&B Antigens ([facility] only)  Result Value Ref Range   Influenza A ([facility]) NEGATIVE NEGATIVE   Influenza B ([facility]) NEGATIVE NEGATIVE     MDM   1. Bronchitis with influenza    We'll place patient on Zithromax Z-Pak per his request. We'll place on some Tessalon Perles  for the cough and an albuterol inhaler for that reason bronchospasm and his daughter states she's been having. Follow-up PCP if not better in a week.  Note: This dictation was prepared with Dragon dictation along with smaller phrase technology. Any transcriptional errors that result from this process are unintentional.    Hassan Rowan, MD 12/12/15 1956

## 2015-12-12 NOTE — Discharge Instructions (Signed)
Cough, Adult °A cough helps to clear your throat and lungs. A cough may last only 2-3 weeks (acute), or it may last longer than 8 weeks (chronic). Many different things can cause a cough. A cough may be a sign of an illness or another medical condition. °HOME CARE °· Pay attention to any changes in your cough. °· Take medicines only as told by your doctor. °· If you were prescribed an antibiotic medicine, take it as told by your doctor. Do not stop taking it even if you start to feel better. °· Talk with your doctor before you try using a cough medicine. °· Drink enough fluid to keep your pee (urine) clear or pale yellow. °· If the air is dry, use a cold steam vaporizer or humidifier in your home. °· Stay away from things that make you cough at work or at home. °· If your cough is worse at night, try using extra pillows to raise your head up higher while you sleep. °· Do not smoke, and try not to be around smoke. If you need help quitting, ask your doctor. °· Do not have caffeine. °· Do not drink alcohol. °· Rest as needed. °GET HELP IF: °· You have new problems (symptoms). °· You cough up yellow fluid (pus). °· Your cough does not get better after 2-3 weeks, or your cough gets worse. °· Medicine does not help your cough and you are not sleeping well. °· You have pain that gets worse or pain that is not helped with medicine. °· You have a fever. °· You are losing weight and you do not know why. °· You have night sweats. °GET HELP RIGHT AWAY IF: °· You cough up blood. °· You have trouble breathing. °· Your heartbeat is very fast. °  °This information is not intended to replace advice given to you by your health care provider. Make sure you discuss any questions you have with your health care provider. °  °Document Released: 05/03/2011 Document Revised: 05/11/2015 Document Reviewed: 10/27/2014 °Elsevier Interactive Patient Education ©2016 [employer] ° °Upper Respiratory Infection, Adult °Most upper respiratory  infections (URIs) are caused by a virus. A URI affects the nose, throat, and upper air passages. The most common type of URI is often called "the common cold." °HOME CARE  °· Take medicines only as told by your doctor. °· Gargle warm saltwater or take cough drops to comfort your throat as told by your doctor. °· Use a warm mist humidifier or inhale steam from a shower to increase air moisture. This may make it easier to breathe. °· Drink enough fluid to keep your pee (urine) clear or pale yellow. °· Eat soups and other clear broths. °· Have a healthy diet. °· Rest as needed. °· Go back to work when your fever is gone or your doctor says it is okay. °· You may need to stay home longer to avoid giving your URI to others. °· You can also wear a face mask and wash your hands often to prevent spread of the virus. °· Use your inhaler more if you have asthma. °· Do not use any tobacco products, including cigarettes, chewing tobacco, or electronic cigarettes. If you need help quitting, ask your doctor. °GET HELP IF: °· You are getting worse, not better. °· Your symptoms are not helped by medicine. °· You have chills. °· You are getting more short of breath. °· You have brown or red mucus. °· You have yellow or brown discharge from   your nose. °· You have pain in your face, especially when you bend forward. °· You have a fever. °· You have puffy (swollen) neck glands. °· You have pain while swallowing. °· You have white areas in the back of your throat. °GET HELP RIGHT AWAY IF:  °· You have very bad or constant: °· Headache. °· Ear pain. °· Pain in your forehead, behind your eyes, and over your cheekbones (sinus pain). °· Chest pain. °· You have long-lasting (chronic) lung disease and any of the following: °· Wheezing. °· Long-lasting cough. °· Coughing up blood. °· A change in your usual mucus. °· You have a stiff neck. °· You have changes in your: °· Vision. °· Hearing. °· Thinking. °· Mood. °MAKE SURE YOU:  °· Understand  these instructions. °· Will watch your condition. °· Will get help right away if you are not doing well or get worse. °  °This information is not intended to replace advice given to you by your health care provider. Make sure you discuss any questions you have with your health care provider. °  °Document Released: 02/06/2008 Document Revised: 01/04/2015 Document Reviewed: 11/25/2013 °Elsevier Interactive Patient Education ©2016 Elsevier Inc. ° ° °

## 2017-05-13 ENCOUNTER — Ambulatory Visit
Admission: EM | Admit: 2017-05-13 | Discharge: 2017-05-13 | Disposition: A | Discharge status: Home / Self Care | Payer: Medicare Other | Attending: Family Medicine | Admitting: Family Medicine

## 2017-05-13 DIAGNOSIS — R059 Cough, unspecified: Secondary | ICD-10-CM

## 2017-05-13 DIAGNOSIS — R05 Cough: Secondary | ICD-10-CM | POA: Diagnosis not present

## 2017-05-13 MED ORDER — DOXYCYCLINE HYCLATE 100 MG PO TABS: 100.0000 mg | ORAL_TABLET | Freq: Two times a day (BID) | ORAL | 0 refills | Status: DC

## 2017-05-13 NOTE — ED Provider Notes (Signed)
[facility]    CSN: [id number] Arrival date & time: 05/13/17  1013     History   Chief Complaint Chief Complaint  Patient presents with  . Cough    HPI Karl Tapia is a 71 y.o. male.   The history is provided by the patient.  Cough  Associated symptoms: wheezing   Associated symptoms: no headaches   URI  Presenting symptoms: cough and fatigue   Severity:  Moderate Onset quality:  Sudden Duration:  10 days Timing:  Constant Progression:  Worsening Chronicity:  New Relieved by:  Nothing Ineffective treatments:  OTC medications Associated symptoms: wheezing   Associated symptoms: no headaches   Risk factors: being elderly and chronic cardiac disease   Risk factors: no chronic kidney disease, no diabetes mellitus, no immunosuppression, no recent illness, no recent travel and no sick contacts  Chronic respiratory disease: former smoker.     Past Medical History:  Diagnosis Date  . Acute blood loss anemia   . CAD (coronary artery disease)   . Gastroesophageal reflux disease   . Hyperlipemia   . Hypertension   . Impotence of non-organic origin   . Mixed anxiety depressive disorder   . Pacemaker   . Paroxysmal A-fib (HCC)   . Sinus node dysfunction (HCC)   . Stroke (HCC)   . Subdural hematoma (HCC)     There are no active problems to display for this patient.   Past Surgical History:  Procedure Laterality Date  . CATARACT EXTRACTION    . CHOLECYSTECTOMY    . HEMORROIDECTOMY    . UPPER GI ENDOSCOPY         Home Medications    Prior to Admission medications   Medication Sig Start Date End Date Taking? Authorizing Provider  aspirin 81 MG tablet Take 81 mg by mouth daily.   Yes [provider]  FLUoxetine (PROZAC) 40 MG capsule Take 40 mg by mouth daily.   Yes [provider]  latanoprost (XALATAN) 0.005 % ophthalmic solution 1 drop at bedtime.   Yes [provider]  metoprolol succinate (TOPROL-XL)  100 MG 24 hr tablet Take 100 mg by mouth daily. Take with or immediately following a meal.   Yes [provider]  albuterol (PROVENTIL HFA;VENTOLIN HFA) 108 (90 Base) MCG/ACT inhaler Inhale 2 puffs into the lungs every 6 (six) hours as needed for wheezing or shortness of breath. 12/12/15   Hassan Rowan, MD  azithromycin (ZITHROMAX Z-PAK) 250 MG tablet Take 2 tablets first day and then 1 po a day for 4 days 12/12/15   Hassan Rowan, MD  benzonatate (TESSALON) 100 MG capsule Take 1 capsule (100 mg total) by mouth every 8 (eight) hours. 12/12/15   Hassan Rowan, MD  doxycycline (VIBRA-TABS) 100 MG tablet Take 1 tablet (100 mg total) by mouth 2 (two) times daily. 05/13/17   Payton Mccallum, MD  omeprazole (PRILOSEC) 20 MG capsule Take 20 mg by mouth daily.    [provider]  simvastatin (ZOCOR) 20 MG tablet Take 20 mg by mouth daily.    [provider]    Family History History reviewed. No pertinent family history.  Social History Social History  Substance Use Topics  . Smoking status: Former Games developer  . Smokeless tobacco: Never Used  . Alcohol use No     Allergies   Erythromycin base; Levofloxacin; Lorazepam; and Oxycodone   Review of Systems Review of Systems  Constitutional: Positive for fatigue.  Respiratory: Positive for cough and  wheezing.   Neurological: Negative for headaches.     Physical Exam Triage Vital Signs ED Triage Vitals  Enc Vitals Group     BP 05/13/17 1049 133/77     Pulse Rate 05/13/17 1049 71     Resp 05/13/17 1049 18     Temp 05/13/17 1049 98.4 F (36.9 C)     Temp Source 05/13/17 1049 Oral     SpO2 05/13/17 1049 98 %     Weight 05/13/17 1048 200 lb (90.7 kg)     Height 05/13/17 1048 5' 5.5" (1.664 m)     Head Circumference --      Peak Flow --      Pain Score 05/13/17 1049 0     Pain Loc --      Pain Edu? --      Excl. in GC? --    No data found.   Updated Vital Signs BP 133/77 (BP Location: Left Arm)   Pulse 71   Temp  98.4 F (36.9 C) (Oral)   Resp 18   Ht 5' 5.5" (1.664 m)   Wt 200 lb (90.7 kg)   SpO2 98%   BMI 32.78 kg/m   Visual Acuity Right Eye Distance:   Left Eye Distance:   Bilateral Distance:    Right Eye Near:   Left Eye Near:    Bilateral Near:     Physical Exam  Constitutional: He appears well-developed and well-nourished. No distress.  HENT:  Head: Normocephalic and atraumatic.  Right Ear: Tympanic membrane, external ear and ear canal normal.  Left Ear: Tympanic membrane, external ear and ear canal normal.  Nose: Nose normal.  Mouth/Throat: Uvula is midline, oropharynx is clear and moist and mucous membranes are normal. No oropharyngeal exudate or tonsillar abscesses.  Eyes: Pupils are equal, round, and reactive to light. Conjunctivae and EOM are normal. Right eye exhibits no discharge. Left eye exhibits no discharge. No scleral icterus.  Neck: Normal range of motion. Neck supple. No tracheal deviation present. No thyromegaly present.  Cardiovascular: Normal rate, regular rhythm and normal heart sounds.   Pulmonary/Chest: Effort normal. No stridor. No respiratory distress. He has no wheezes. He has rales (right base). He exhibits no tenderness.  Lymphadenopathy:    He has no cervical adenopathy.  Neurological: He is alert.  Skin: Skin is warm and dry. No rash noted. He is not diaphoretic.  Nursing note and vitals reviewed.    UC Treatments / Results  Labs (all labs ordered are listed, but only abnormal results are displayed) Labs Reviewed - No data to display  EKG  EKG Interpretation None       Radiology No results found.  Procedures Procedures (including critical care time)  Medications Ordered in UC Medications - No data to display   Initial Impression / Assessment and Plan / UC Course  I have reviewed the triage vital signs and the nursing notes.  Pertinent labs & imaging results that were available during my care of the patient were reviewed by me and  considered in my medical decision making (see chart for details).       Final Clinical Impressions(s) / UC Diagnoses   Final diagnoses:  Cough    New Prescriptions Discharge Medication List as of 05/13/2017 12:02 PM    START taking these medications   Details  doxycycline (VIBRA-TABS) 100 MG tablet Take 1 tablet (100 mg total) by mouth 2 (two) times daily., Starting Mon 05/13/2017, Normal  1. diagnosis reviewed with patient 2. rx as per orders above; reviewed possible side effects, interactions, risks and benefits  3. Recommend supportive treatment with rest, fluids, otc cough meds prn 4. Follow-up prn if symptoms worsen or don't improve Controlled Substance Prescriptions [place] Controlled Substance Registry consulted? Not Applicable   Payton Mccallum, MD 05/13/17 1235

## 2017-05-13 NOTE — ED Triage Notes (Signed)
Patient complains of cough and congestion x 10 days. Patient wife reports that he has had pneumonia before.

## 2018-07-14 ENCOUNTER — Other Ambulatory Visit: Payer: Self-pay | Admitting: Physician Assistant

## 2018-07-14 DIAGNOSIS — H9202 Otalgia, left ear: Secondary | ICD-10-CM

## 2018-07-16 ENCOUNTER — Encounter (INDEPENDENT_AMBULATORY_CARE_PROVIDER_SITE_OTHER): Payer: Self-pay

## 2018-07-16 ENCOUNTER — Ambulatory Visit
Admission: RE | Admit: 2018-07-16 | Discharge: 2018-07-16 | Disposition: A | Discharge status: Home / Self Care | Payer: Medicare Other | Source: Ambulatory Visit | Attending: Physician Assistant | Admitting: Physician Assistant

## 2018-07-16 ENCOUNTER — Other Ambulatory Visit
Admission: RE | Admit: 2018-07-16 | Discharge: 2018-07-16 | Disposition: A | Discharge status: Home / Self Care | Payer: Medicare Other | Source: Ambulatory Visit | Attending: Physician Assistant | Admitting: Physician Assistant

## 2018-07-16 DIAGNOSIS — H9202 Otalgia, left ear: Secondary | ICD-10-CM | POA: Diagnosis not present

## 2018-07-16 DIAGNOSIS — R590 Localized enlarged lymph nodes: Secondary | ICD-10-CM | POA: Diagnosis not present

## 2018-07-16 HISTORY — DX: Heart failure, unspecified: I50.9

## 2018-07-16 LAB — CREATININE, SERUM
CREATININE: 1.23 mg/dL (ref 0.61–1.24)
GFR calc non Af Amer: 57 mL/min — ABNORMAL LOW (ref >60)

## 2018-07-16 LAB — BUN: BUN: 13 mg/dL (ref 8–23)

## 2018-07-16 MED ORDER — IOHEXOL 300 MG/ML  SOLN
75.0000 mL | Freq: Once | INTRAMUSCULAR | Status: AC | PRN
  Administered 2018-07-16: 75 mL via INTRAVENOUS

## 2021-02-22 ENCOUNTER — Ambulatory Visit
Admission: EM | Admit: 2021-02-22 | Discharge: 2021-02-22 | Disposition: A | Discharge status: Home / Self Care | Payer: Medicare Other

## 2021-02-22 ENCOUNTER — Encounter: Payer: Self-pay | Admitting: Emergency Medicine

## 2021-02-22 ENCOUNTER — Other Ambulatory Visit: Payer: Self-pay

## 2021-02-22 DIAGNOSIS — W57XXXA Bitten or stung by nonvenomous insect and other nonvenomous arthropods, initial encounter: Secondary | ICD-10-CM

## 2021-02-22 DIAGNOSIS — L299 Pruritus, unspecified: Secondary | ICD-10-CM

## 2021-02-22 DIAGNOSIS — S20462A Insect bite (nonvenomous) of left back wall of thorax, initial encounter: Secondary | ICD-10-CM | POA: Diagnosis not present

## 2021-02-22 NOTE — ED Provider Notes (Signed)
[facility]    CSN: [id number] Arrival date & time: 02/22/21  1191      History   Chief Complaint Chief Complaint  Patient presents with   Back Pain    Left shoulder blade    HPI Keithon Mccoin is a 75 y.o. male.   HPI  75 year old male here for evaluation of itching.  Patient is here with his wife and is complaining of itching to his left shoulder blade that is been present for the last 3 days.  He initially told triage that he was having pain as well but he says he is not really having any pain just itching.  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  Patient's wife reports that she did not see any rash or bites when he told her about the itching yesterday.  She did use a topical counter irritant from [place] which seemed to help the symptoms last night.  Past Medical History:  Diagnosis Date   Acute blood loss anemia    CAD (coronary artery disease)    CHF (congestive heart failure) (HCC)    Gastroesophageal reflux disease    Hyperlipemia    Hypertension    Impotence of non-organic origin    Mixed anxiety depressive disorder    Pacemaker    Paroxysmal A-fib (HCC)    Sinus node dysfunction (HCC)    Stroke (HCC)    Subdural hematoma (HCC)     There are no problems to display for this patient.   Past Surgical History:  Procedure Laterality Date   CATARACT EXTRACTION     CHOLECYSTECTOMY     HEMORROIDECTOMY     UPPER GI ENDOSCOPY         Home Medications    Prior to Admission medications   Medication Sig Start Date End Date Taking? Authorizing Provider  albuterol (PROVENTIL HFA;VENTOLIN HFA) 108 (90 Base) MCG/ACT inhaler Inhale 2 puffs into the lungs every 6 (six) hours as needed for wheezing or shortness of breath. 12/12/15  Yes Hassan Rowan, MD  aspirin 81 MG tablet Take 81 mg by mouth daily.   Yes [provider]  atorvastatin (LIPITOR) 80 MG tablet Take 1 tablet by mouth daily.  01/24/21  Yes [provider]  clopidogrel (PLAVIX) 75 MG tablet Take 1 tablet by mouth daily. 12/29/20  Yes [provider]  FLUoxetine (PROZAC) 10 MG tablet Take 10 mg by mouth daily. 12/18/20  Yes [provider]  latanoprost (XALATAN) 0.005 % ophthalmic solution 1 drop at bedtime.   Yes [provider]  lisinopril (ZESTRIL) 10 MG tablet Take by mouth. 12/18/20  Yes [provider]  metoprolol succinate (TOPROL-XL) 100 MG 24 hr tablet Take 100 mg by mouth daily. Take with or immediately following a meal.   Yes [provider]  omeprazole (PRILOSEC) 20 MG capsule Take 20 mg by mouth daily.   Yes [provider]    Family History History reviewed. No pertinent family history.  Social History Social History   Tobacco Use   Smoking status: Former    Pack years: 0.00   Smokeless tobacco: Never  Vaping Use   Vaping Use: Never used  Substance Use Topics   Alcohol use: No   Drug use: No     Allergies   Erythromycin base, Levofloxacin, Lorazepam, and Oxycodone   Review of Systems Review of Systems  Constitutional:  Negative for activity change, appetite change and fever.  Skin:  Negative for color change and rash.    Physical Exam Triage Vital Signs ED Triage Vitals [02/22/21 0946]  Enc Vitals Group     BP      Pulse      Resp      Temp      Temp src      SpO2      Weight      Height      Head Circumference      Peak Flow      Pain Score 5     Pain Loc      Pain Edu?      Excl. in GC?    No data found.  Updated Vital Signs BP 111/69 (BP Location: Left Arm)   Pulse 63   Temp 98 F (36.7 C) (Oral)   Resp 18   Ht 5' 5.5" (1.664 m)   Wt 172 lb (78 kg)   SpO2 100%   BMI 28.19 kg/m   Visual Acuity Right Eye Distance:   Left Eye Distance:   Bilateral Distance:    Right Eye Near:   Left Eye Near:    Bilateral Near:     Physical Exam Vitals and nursing note reviewed.  Constitutional:       General: He is not in acute distress.    Appearance: Normal appearance. He is normal weight. He is not ill-appearing.  HENT:     Head: Normocephalic and atraumatic.  Skin:    General: Skin is warm and dry.     Capillary Refill: Capillary refill takes less than 2 seconds.     Findings: Erythema present.  Neurological:     General: No focal deficit present.     Mental Status: He is alert and oriented to person, place, and time.  Psychiatric:        Mood and Affect: Mood normal.        Behavior: Behavior normal.        Thought Content: Thought content normal.        Judgment: Judgment normal.     UC Treatments / Results  Labs (all labs ordered are listed, but only abnormal results are displayed) Labs Reviewed - No data to display  EKG   Radiology No results found.  Procedures Procedures (including critical care time)  Medications Ordered in UC Medications - No data to display  Initial Impression / Assessment and Plan / UC Course  I have reviewed the triage vital signs and the nursing notes.  Pertinent labs & imaging results that were available during my care of the patient were reviewed by me and considered in my medical decision making (see chart for details).  Patient is a very pleasant, nontoxic-appearing 75 year old male here for evaluation of itching to his left shoulder blade as outlined in HPI above.  Patient's physical exam reveals a small, pencil head eraser sized area of erythema just below with the patient indicates he is having itching.  When viewed under magnification there are 2 small red dots which could represent an insect bite.  The area is not indurated, hot, erythematous, tender, or edematous.  We will treat patient conservatively with over-the-counter Allegra 180 mg daily to help with itching and topical Benadryl as needed.  Patient does have a significant amount of nevi and dilated pores of Wiener across his back.  I have suggested to patient and his spouse  that he see dermatology at least once a year for a skin survey  and they are in agreement.   Final Clinical Impressions(s) / UC Diagnoses   Final diagnoses:  Itching  Insect bite of left back wall of thorax, initial encounter     Discharge Instructions      Take OTC Allegra 180 mg daily to help with itching.  You can apply topical Benadryl cream as well to help with itching.     ED Prescriptions   None    PDMP not reviewed this encounter.   Becky Augusta, NP 02/22/21 1012

## 2021-02-22 NOTE — ED Triage Notes (Addendum)
Pt c/o pain and itching around the left shoulder blade for several days. Pt denies any known injuries or any heavy lifting. Pt denies any radiating pain. Pt does have full ROM. There is no rash or other skin problem present in the area. Pt does report there may have been a rash in the area a couple weeks ago.

## 2021-02-22 NOTE — Discharge Instructions (Addendum)
Take OTC Allegra 180 mg daily to help with itching.  You can apply topical Benadryl cream as well to help with itching.

## 2021-06-19 ENCOUNTER — Encounter: Payer: Self-pay | Admitting: Ophthalmology

## 2021-06-27 NOTE — Discharge Instructions (Signed)

## 2021-06-28 ENCOUNTER — Other Ambulatory Visit: Payer: Self-pay

## 2021-06-28 ENCOUNTER — Ambulatory Visit
Admission: RE | Admit: 2021-06-28 | Discharge: 2021-06-28 | Disposition: A | Discharge status: Home / Self Care | Payer: Medicare Other | Attending: Ophthalmology | Admitting: Ophthalmology

## 2021-06-28 ENCOUNTER — Encounter: Payer: Self-pay | Admitting: Ophthalmology

## 2021-06-28 ENCOUNTER — Encounter
Admission: RE | Disposition: A | Discharge status: Home / Self Care | Payer: Self-pay | Source: Home / Self Care | Attending: Ophthalmology

## 2021-06-28 ENCOUNTER — Ambulatory Visit: Payer: Medicare Other | Admitting: Anesthesiology

## 2021-06-28 DIAGNOSIS — Z87891 Personal history of nicotine dependence: Secondary | ICD-10-CM | POA: Diagnosis not present

## 2021-06-28 DIAGNOSIS — H401121 Primary open-angle glaucoma, left eye, mild stage: Secondary | ICD-10-CM | POA: Diagnosis not present

## 2021-06-28 DIAGNOSIS — H2512 Age-related nuclear cataract, left eye: Secondary | ICD-10-CM | POA: Insufficient documentation

## 2021-06-28 HISTORY — DX: Presence of external hearing-aid: Z97.4

## 2021-06-28 HISTORY — DX: Presence of other cardiac implants and grafts: Z95.818

## 2021-06-28 HISTORY — DX: Aphasia following cerebral infarction: I69.320

## 2021-06-28 HISTORY — PX: CATARACT EXTRACTION W/PHACO: SHX586

## 2021-06-28 SURGERY — PHACOEMULSIFICATION, CATARACT, WITH IOL INSERTION
Anesthesia: Monitor Anesthesia Care | Site: Eye | Laterality: Left

## 2021-06-28 MED ORDER — SIGHTPATH DOSE#1 BSS IO SOLN
INTRAOCULAR | Status: DC | PRN
  Administered 2021-06-28: 68 mL via OPHTHALMIC

## 2021-06-28 MED ORDER — LACTATED RINGERS IV SOLN: INTRAVENOUS | Status: DC

## 2021-06-28 MED ORDER — DEXMEDETOMIDINE (PRECEDEX) IN NS 20 MCG/5ML (4 MCG/ML) IV SYRINGE
PREFILLED_SYRINGE | INTRAVENOUS | Status: DC | PRN
  Administered 2021-06-28: 5 ug via INTRAVENOUS

## 2021-06-28 MED ORDER — ARMC OPHTHALMIC DILATING DROPS
1.0000 "application " | OPHTHALMIC | Status: DC | PRN
  Administered 2021-06-28 (×3): 1 via OPHTHALMIC

## 2021-06-28 MED ORDER — SIGHTPATH DOSE#1 NA HYALUR & NA CHOND-NA HYALUR IO KIT
PACK | INTRAOCULAR | Status: DC | PRN
  Administered 2021-06-28 (×2): 1 via OPHTHALMIC

## 2021-06-28 MED ORDER — TETRACAINE HCL 0.5 % OP SOLN
1.0000 [drp] | OPHTHALMIC | Status: DC | PRN
  Administered 2021-06-28 (×3): 1 [drp] via OPHTHALMIC

## 2021-06-28 MED ORDER — SIGHTPATH DOSE#1 BSS IO SOLN
INTRAOCULAR | Status: DC | PRN
  Administered 2021-06-28: 1 mL via INTRAMUSCULAR

## 2021-06-28 MED ORDER — CEFUROXIME OPHTHALMIC INJECTION 1 MG/0.1 ML
INJECTION | OPHTHALMIC | Status: DC | PRN
  Administered 2021-06-28: 0.1 mL via OPHTHALMIC

## 2021-06-28 MED ORDER — SIGHTPATH DOSE#1 BSS IO SOLN
INTRAOCULAR | Status: DC | PRN
  Administered 2021-06-28: 15 mL

## 2021-06-28 MED ORDER — FENTANYL CITRATE (PF) 100 MCG/2ML IJ SOLN
INTRAMUSCULAR | Status: DC | PRN
  Administered 2021-06-28: 50 ug via INTRAVENOUS

## 2021-06-28 MED ORDER — NEOMYCIN-POLYMYXIN-DEXAMETH 3.5-10000-0.1 OP OINT
TOPICAL_OINTMENT | OPHTHALMIC | Status: DC | PRN
Start: 2021-06-28 — End: 2021-06-28
  Administered 2021-06-28: 1 via OPHTHALMIC

## 2021-06-28 SURGICAL SUPPLY — 15 items
CANNULA ANT/CHMB 27GA (MISCELLANEOUS) ×2 IMPLANT
GLOVE SRG 8 PF TXTR STRL LF DI (GLOVE) ×1 IMPLANT
GLOVE SURG ENC TEXT LTX SZ7.5 (GLOVE) ×2 IMPLANT
GLOVE SURG UNDER POLY LF SZ8 (GLOVE) ×2
GOWN STRL REUS W/ TWL LRG LVL3 (GOWN DISPOSABLE) ×2 IMPLANT
GOWN STRL REUS W/TWL LRG LVL3 (GOWN DISPOSABLE) ×4
LENS IOL TECNIS EYHANCE 11.5 (Intraocular Lens) ×2 IMPLANT
MARKER SKIN DUAL TIP RULER LAB (MISCELLANEOUS) ×2 IMPLANT
NEEDLE FILTER BLUNT 18X 1/2SAF (NEEDLE) ×2
NEEDLE FILTER BLUNT 18X1 1/2 (NEEDLE) ×2 IMPLANT
SYR 3ML LL SCALE MARK (SYRINGE) ×4 IMPLANT
SYR TB 1ML LUER SLIP (SYRINGE) ×2 IMPLANT
SYSTEM OMNI OPHTHALMIC STRL (OPHTHALMIC) ×2 IMPLANT
WATER STERILE IRR 250ML POUR (IV SOLUTION) ×2 IMPLANT
WIPE NON LINTING 3.25X3.25 (MISCELLANEOUS) ×2 IMPLANT

## 2021-06-28 NOTE — Anesthesia Postprocedure Evaluation (Signed)
Anesthesia Post Note  Patient: Karl Tapia  Procedure(s) Performed: CATARACT EXTRACTION PHACO AND INTRAOCULAR LENS PLACEMENT (IOC) LEFT OMNI CANALOPLASTY & TRABECULECTOMY (Left: Eye)     Patient location during evaluation: PACU Anesthesia Type: MAC Level of consciousness: awake and alert Pain management: pain level controlled Vital Signs Assessment: post-procedure vital signs reviewed and stable Respiratory status: spontaneous breathing Cardiovascular status: stable Anesthetic complications: no   No notable events documented.  Gillian Scarce

## 2021-06-28 NOTE — Transfer of Care (Signed)
Immediate Anesthesia Transfer of Care Note  Patient: Karl Tapia  Procedure(s) Performed: CATARACT EXTRACTION PHACO AND INTRAOCULAR LENS PLACEMENT (IOC) LEFT OMNI CANALOPLASTY & TRABECULECTOMY (Left: Eye)  Patient Location: PACU  Anesthesia Type: MAC  Level of Consciousness: awake, alert  and patient cooperative  Airway and Oxygen Therapy: Patient Spontanous Breathing and Patient connected to supplemental oxygen  Post-op Assessment: Post-op Vital signs reviewed, Patient's Cardiovascular Status Stable, Respiratory Function Stable, Patent Airway and No signs of Nausea or vomiting  Post-op Vital Signs: Reviewed and stable  Complications: No notable events documented.

## 2021-06-28 NOTE — Op Note (Signed)
PREOPERATIVE DIAGNOSIS:  Nuclear sclerotic cataract left eye. 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 Item Serial No. Manufacturer Lot No. LRB No. Used Action  LENS IOL TECNIS EYHANCE 11.5 - [id number] Intraocular Lens LENS IOL TECNIS EYHANCE 11.5 [id number] JOHNSON   Left 1 Implanted    SURGEON:  Deirdre Evener, MD   ANESTHESIA:  Topical with tetracaine drops augmented with 1% preservative-free intracameral lidocaine.    COMPLICATIONS:  None.   DESCRIPTION OF PROCEDURE:  The patient was identified in the holding room and transported to the operating room and placed in the supine position under the operating microscope.  The left eye was identified as the operative eye and it was prepped and draped in the usual sterile ophthalmic fashion.   A 1 millimeter clear-corneal paracentesis was made at the 5:30 position.  0.5 ml of preservative-free 1% lidocaine was injected into the anterior chamber.  The anterior chamber was filled with Viscoat viscoelastic.  A 2.4 millimeter keratome was used to make a near-clear corneal incision at the 2:30 position. A curvilinear capsulorrhexis was made with a cystotome and capsulorrhexis forceps.  Balanced salt solution was used to hydrodissect and hydrodelineate the nucleus.   Phacoemulsification was then used in stop and chop fashion to remove the lens nucleus and epinucleus.  The remaining cortex was then removed using the irrigation  and aspiration handpiece. Provisc was then placed into the capsular bag to distend it for lens placement.  A lens was then injected into the capsular bag.    The microscope was adjusted and a gonioprism was used to visulaize the trabecular meshwork.  The Omni device was inserted and advanced toward the trabecular meshwork.  The cannula tip was used to gain access to Schlemm's canal.  The microcatheter was advanced into the inferior angle for 180 degrees.  Upon retraction of the microcatheter, a controlled amount of Provisc was delivered into Schlemm's canal.  The cannula was withdrawn from the anterior chamber and additional Viscoat was placed into the eye.  The Omni was reinserted in the opposite direction to perform 180 degree canaloplasty of the superior 180 degrees as noted above.  After canaloplasty was completed, the microcatheter was advanced into the canal for 180 degrees in the inferior angle.  The catheter was used to unroof the canal by gently withdrawing it while exiting the eye.  The remaining viscoelastic was aspirated.   Wounds were hydrated with balanced salt solution.  The anterior chamber was inflated to a physiologic pressure with balanced salt solution.  No wound leaks were noted. Cefuroxime 0.1 ml of a 10mg /ml solution was injected into the anterior chamber for a dose of 1 mg of intracameral antibiotic at the completion of the case. Maxitrol ointment was applied.  The patient was taken to the recovery room in stable condition without complications of anesthesia or surgery.

## 2021-06-28 NOTE — Anesthesia Procedure Notes (Signed)
Procedure Name: MAC Date/Time: 06/28/2021 12:50 PM Performed by: Dionne Bucy, CRNA Pre-anesthesia Checklist: Patient identified, Emergency Drugs available, Suction available, Patient being monitored and Timeout performed Patient Re-evaluated:Patient Re-evaluated prior to induction Oxygen Delivery Method: Nasal cannula Placement Confirmation: positive ETCO2

## 2021-06-28 NOTE — H&P (Signed)
[facility]   Primary Care Physician:  [facility], [place] Primary Ophthalmologist: Dr. Lockie Mola  Pre-Procedure History & Physical: HPI:  Karl Tapia is a 75 y.o. male here for ophthalmic surgery.   Past Medical History:  Diagnosis Date   Acute blood loss anemia    Aphasia as late effect of cerebrovascular accident    CAD (coronary artery disease)    CHF (congestive heart failure) (HCC)    Gastroesophageal reflux disease    Hyperlipemia    Hypertension    Impotence of non-organic origin    MI (myocardial infarction) (HCC) 1996   Mixed anxiety depressive disorder    Pacemaker    Paroxysmal A-fib (HCC)    Presence of Watchman left atrial appendage closure device    Sinus node dysfunction (HCC)    Stroke (HCC)    2009, 2020   Subdural hematoma    Wears hearing aid in both ears     Past Surgical History:  Procedure Laterality Date   CATARACT EXTRACTION     CHOLECYSTECTOMY     HEMORROIDECTOMY     UPPER GI ENDOSCOPY      Prior to Admission medications   Medication Sig Start Date End Date Taking? Authorizing Provider  aspirin 81 MG tablet Take 81 mg by mouth daily.   Yes [provider]  atorvastatin (LIPITOR) 80 MG tablet Take 1 tablet by mouth daily. 01/24/21  Yes [provider]  Coenzyme Q10-Fish Oil-Vit E (CO-Q 10 OMEGA-3 FISH OIL PO) Take by mouth.   Yes [provider]  Docusate Calcium (STOOL SOFTENER LAXATIVE DC PO) Take by mouth.   Yes [provider]  FLUoxetine (PROZAC) 10 MG tablet Take 10 mg by mouth daily. 12/18/20  Yes [provider]  latanoprost (XALATAN) 0.005 % ophthalmic solution 1 drop at bedtime.   Yes [provider]  lisinopril (ZESTRIL) 10 MG tablet Take by mouth. 12/18/20  Yes [provider]  metoprolol succinate (TOPROL-XL) 100 MG 24 hr tablet Take 100 mg by mouth in the morning and at bedtime. Take with or immediately following a meal.   Yes [provider]    Allergies as of 06/14/2021 - Review Complete 02/22/2021  Allergen Reaction Noted   Erythromycin base  12/12/2015   Levofloxacin  12/12/2015   Lorazepam  12/12/2015   Oxycodone  12/12/2015    History reviewed. No pertinent family history.  Social History   Socioeconomic History   Marital status: Married    Spouse name: Not on file   Number of children: Not on file   Years of education: Not on file   Highest education level: Not on file  Occupational History   Not on file  Tobacco Use   Smoking status: Former    Types: Cigarettes    Quit date: 22    Years since quitting: 39.8   Smokeless tobacco: Never  Vaping Use   Vaping Use: Never used  Substance and Sexual Activity   Alcohol use: No   Drug use: No   Sexual activity: Not on file  Other Topics Concern   Not on file  Social History Narrative   Not on file   Social Determinants of Health   Financial Resource Strain: Not on file  Food Insecurity: Not on file  Transportation Needs: Not on file  Physical Activity: Not on file  Stress: Not on file  Social Connections: Not on file  Intimate Partner Violence: Not on file    Review of  Systems: See HPI, otherwise negative ROS  Physical Exam: BP 128/78   Pulse 67   Temp 97.8 F (36.6 C) (Temporal)   Resp 18   Ht 5' 5.5" (1.664 m)   Wt 75.8 kg   SpO2 99%   BMI 27.37 kg/m  General:   Alert,  pleasant and cooperative in NAD Head:  Normocephalic and atraumatic. Lungs:  Clear to auscultation.    Heart:  Regular rate and rhythm.   Impression/Plan: Karl Tapia is here for ophthalmic surgery.  Risks, benefits, limitations, and alternatives regarding ophthalmic surgery have been reviewed with the patient.  Questions have been answered.  All parties agreeable.   Lockie Mola, MD  06/28/2021, 11:46 AM

## 2021-06-28 NOTE — Anesthesia Preprocedure Evaluation (Signed)
Anesthesia Evaluation  Patient identified by MRN, date of birth, ID band Patient awake    Reviewed: Allergy & Precautions, H&P , NPO status , Patient's Chart, lab work & pertinent test results  Airway Mallampati: II  TM Distance: >3 FB Neck ROM: full    Dental no notable dental hx.    Pulmonary neg pulmonary ROS, former smoker,    Pulmonary exam normal        Cardiovascular hypertension, + CAD, + Past MI and +CHF  Normal cardiovascular exam+ pacemaker  Rhythm:regular Rate:Normal     Neuro/Psych CVA    GI/Hepatic Medicated,  Endo/Other  negative endocrine ROS  Renal/GU negative Renal ROS     Musculoskeletal   Abdominal   Peds  Hematology   Anesthesia Other Findings   Reproductive/Obstetrics                             Anesthesia Physical Anesthesia Plan  ASA: 3  Anesthesia Plan: MAC   Post-op Pain Management:    Induction:   PONV Risk Score and Plan: 1 and TIVA, Midazolam and Treatment may vary due to age or medical condition  Airway Management Planned:   Additional Equipment:   Intra-op Plan:   Post-operative Plan:   Informed Consent: I have reviewed the patients History and Physical, chart, labs and discussed the procedure including the risks, benefits and alternatives for the proposed anesthesia with the patient or authorized representative who has indicated his/her understanding and acceptance.       Plan Discussed with:   Anesthesia Plan Comments:         Anesthesia Quick Evaluation

## 2021-06-29 ENCOUNTER — Encounter: Payer: Self-pay | Admitting: Ophthalmology

## 2021-11-20 ENCOUNTER — Encounter: Payer: Self-pay | Admitting: Ophthalmology

## 2021-11-20 NOTE — Discharge Instructions (Signed)

## 2021-11-22 ENCOUNTER — Encounter
Admission: RE | Disposition: A | Discharge status: Home / Self Care | Payer: Self-pay | Source: Home / Self Care | Attending: Ophthalmology

## 2021-11-22 ENCOUNTER — Ambulatory Visit
Admission: RE | Admit: 2021-11-22 | Discharge: 2021-11-22 | Disposition: A | Discharge status: Home / Self Care | Payer: Medicare Other | Attending: Ophthalmology | Admitting: Ophthalmology

## 2021-11-22 ENCOUNTER — Other Ambulatory Visit: Payer: Self-pay

## 2021-11-22 ENCOUNTER — Ambulatory Visit: Payer: Medicare Other | Admitting: Anesthesiology

## 2021-11-22 ENCOUNTER — Encounter: Payer: Self-pay | Admitting: Ophthalmology

## 2021-11-22 DIAGNOSIS — I110 Hypertensive heart disease with heart failure: Secondary | ICD-10-CM | POA: Insufficient documentation

## 2021-11-22 DIAGNOSIS — Z950 Presence of cardiac pacemaker: Secondary | ICD-10-CM | POA: Insufficient documentation

## 2021-11-22 DIAGNOSIS — K219 Gastro-esophageal reflux disease without esophagitis: Secondary | ICD-10-CM | POA: Insufficient documentation

## 2021-11-22 DIAGNOSIS — Z79899 Other long term (current) drug therapy: Secondary | ICD-10-CM | POA: Diagnosis not present

## 2021-11-22 DIAGNOSIS — I252 Old myocardial infarction: Secondary | ICD-10-CM | POA: Insufficient documentation

## 2021-11-22 DIAGNOSIS — I509 Heart failure, unspecified: Secondary | ICD-10-CM | POA: Insufficient documentation

## 2021-11-22 DIAGNOSIS — Z87891 Personal history of nicotine dependence: Secondary | ICD-10-CM | POA: Insufficient documentation

## 2021-11-22 DIAGNOSIS — E785 Hyperlipidemia, unspecified: Secondary | ICD-10-CM | POA: Diagnosis not present

## 2021-11-22 DIAGNOSIS — Z6828 Body mass index (BMI) 28.0-28.9, adult: Secondary | ICD-10-CM | POA: Insufficient documentation

## 2021-11-22 DIAGNOSIS — H401111 Primary open-angle glaucoma, right eye, mild stage: Secondary | ICD-10-CM | POA: Diagnosis present

## 2021-11-22 DIAGNOSIS — I69351 Hemiplegia and hemiparesis following cerebral infarction affecting right dominant side: Secondary | ICD-10-CM | POA: Insufficient documentation

## 2021-11-22 DIAGNOSIS — I480 Paroxysmal atrial fibrillation: Secondary | ICD-10-CM | POA: Diagnosis not present

## 2021-11-22 DIAGNOSIS — I2510 Atherosclerotic heart disease of native coronary artery without angina pectoris: Secondary | ICD-10-CM | POA: Diagnosis not present

## 2021-11-22 DIAGNOSIS — F418 Other specified anxiety disorders: Secondary | ICD-10-CM | POA: Insufficient documentation

## 2021-11-22 HISTORY — PX: CATARACT EXTRACTION W/PHACO: SHX586

## 2021-11-22 HISTORY — DX: Weakness: R53.1

## 2021-11-22 SURGERY — PHACOEMULSIFICATION, CATARACT, WITH IOL INSERTION
Anesthesia: Monitor Anesthesia Care | Site: Eye | Laterality: Right

## 2021-11-22 MED ORDER — SIGHTPATH DOSE#1 BSS IO SOLN
INTRAOCULAR | Status: DC | PRN
  Administered 2021-11-22: 1 mL via INTRAMUSCULAR

## 2021-11-22 MED ORDER — TETRACAINE HCL 0.5 % OP SOLN
OPHTHALMIC | Status: DC | PRN
Start: 2021-11-22 — End: 2021-11-22
  Administered 2021-11-22 (×2): 1 [drp] via OPHTHALMIC

## 2021-11-22 MED ORDER — SIGHTPATH DOSE#1 NA HYALUR & NA CHOND-NA HYALUR IO KIT
PACK | INTRAOCULAR | Status: DC | PRN
  Administered 2021-11-22: 1 via OPHTHALMIC

## 2021-11-22 MED ORDER — TETRACAINE HCL 0.5 % OP SOLN
1.0000 [drp] | OPHTHALMIC | Status: DC | PRN
Start: 2021-11-22 — End: 2021-11-22
  Administered 2021-11-22: 1 [drp] via OPHTHALMIC

## 2021-11-22 MED ORDER — CEFUROXIME OPHTHALMIC INJECTION 1 MG/0.1 ML
INJECTION | OPHTHALMIC | Status: DC | PRN
  Administered 2021-11-22: 0.1 mL via INTRACAMERAL

## 2021-11-22 MED ORDER — LACTATED RINGERS IV SOLN: INTRAVENOUS | Status: DC

## 2021-11-22 MED ORDER — SIGHTPATH DOSE#1 BSS IO SOLN
INTRAOCULAR | Status: DC | PRN
  Administered 2021-11-22: 15 mL

## 2021-11-22 MED ORDER — MIDAZOLAM HCL 2 MG/2ML IJ SOLN
INTRAMUSCULAR | Status: DC | PRN
Start: 2021-11-22 — End: 2021-11-22
  Administered 2021-11-22: 1 mg via INTRAVENOUS

## 2021-11-22 MED ORDER — SIGHTPATH DOSE#1 NA CHONDROIT SULF-NA HYALURON 20-15 MG/0.5ML IO SOSY
INTRAOCULAR | Status: DC | PRN
  Administered 2021-11-22: .5 mL via INTRAOCULAR

## 2021-11-22 MED ORDER — SIGHTPATH DOSE#1 BSS IO SOLN
INTRAOCULAR | Status: DC | PRN
  Administered 2021-11-22: 36 mL via OPHTHALMIC

## 2021-11-22 MED ORDER — FENTANYL CITRATE (PF) 100 MCG/2ML IJ SOLN
INTRAMUSCULAR | Status: DC | PRN
  Administered 2021-11-22: 50 ug via INTRAVENOUS

## 2021-11-22 SURGICAL SUPPLY — 12 items
CATARACT SUITE SIGHTPATH (MISCELLANEOUS) ×2 IMPLANT
FEE CATARACT SUITE SIGHTPATH (MISCELLANEOUS) ×1 IMPLANT
GLOVE SRG 8 PF TXTR STRL LF DI (GLOVE) ×1 IMPLANT
GLOVE SURG ENC TEXT LTX SZ7.5 (GLOVE) ×2 IMPLANT
GLOVE SURG UNDER POLY LF SZ8 (GLOVE) ×2
ICLIP (OPHTHALMIC RELATED) ×1 IMPLANT
NDL FILTER BLUNT 18X1 1/2 (NEEDLE) ×1 IMPLANT
NEEDLE FILTER BLUNT 18X 1/2SAF (NEEDLE) ×1
NEEDLE FILTER BLUNT 18X1 1/2 (NEEDLE) ×1 IMPLANT
SYR 3ML LL SCALE MARK (SYRINGE) ×2 IMPLANT
SYSTEM OMNI OPHTHALMIC STRL (OPHTHALMIC) ×1 IMPLANT
WATER STERILE IRR 250ML POUR (IV SOLUTION) ×2 IMPLANT

## 2021-11-22 NOTE — Anesthesia Preprocedure Evaluation (Signed)
Anesthesia Evaluation  ?Patient identified by MRN, date of birth, ID band ?Patient awake ? ? ? ?Reviewed: ?NPO status  ? ?Airway ?Mallampati: II ? ?TM Distance: >3 FB ?Neck ROM: full ? ? ? Dental ?no notable dental hx. ? ?  ?Pulmonary ?neg pulmonary ROS, former smoker,  ?  ?Pulmonary exam normal ? ? ? ? ? ? ? Cardiovascular ?Exercise Tolerance: Good ?hypertension, + CAD and + Past MI (1996)  ?+ dysrhythmias (p. afib.) Atrial Fibrillation + pacemaker  ? ?echo: 2021:  ?findings:The left ventricular contractile function is  ?normal. The mitral valve leaflets are mildly thickened with normal mobility.  ?There is trivial mitral regurgitation.; ? ?Watchman left atrial appendage closure device; ?  ?Neuro/Psych ?Anxiety Depression Glaucoma; ? ?HOH; ?TIA (2020)CVA (2009 > R weak), Residual Symptoms   ? GI/Hepatic ?Neg liver ROS, GERD  Controlled,  ?Endo/Other  ?Morbid obesity (bmi 28) ? Renal/GU ?negative Renal ROS  ?negative genitourinary ?  ?Musculoskeletal ? ? Abdominal ?  ?Peds ? Hematology ?negative hematology ROS ?(+)   ?Anesthesia Other Findings ?Poor historian; ? Reproductive/Obstetrics ? ?  ? ? ? ? ? ? ? ? ? ? ? ? ? ?  ?  ? ? ? ? ? ? ? ? ?Anesthesia Physical ?Anesthesia Plan ? ?ASA: 3 ? ?Anesthesia Plan: MAC  ? ?Post-op Pain Management:   ? ?Induction:  ? ?PONV Risk Score and Plan: 1 and Midazolam ? ?Airway Management Planned:  ? ?Additional Equipment:  ? ?Intra-op Plan:  ? ?Post-operative Plan:  ? ?Informed Consent: I have reviewed the patients History and Physical, chart, labs and discussed the procedure including the risks, benefits and alternatives for the proposed anesthesia with the patient or authorized representative who has indicated his/her understanding and acceptance.  ? ? ? ? ? ?Plan Discussed with: CRNA ? ?Anesthesia Plan Comments:   ? ? ? ? ? ? ?Anesthesia Quick Evaluation ? ?

## 2021-11-22 NOTE — Transfer of Care (Signed)
Immediate Anesthesia Transfer of Care Note ? ?Patient: Karl Tapia ? ?Procedure(s) Performed: OMNI CANALOPLASTY / TRABECULOTOMY RIGHT (Right: Eye) ? ?Patient Location: PACU ? ?Anesthesia Type: MAC ? ?Level of Consciousness: awake, alert  and patient cooperative ? ?Airway and Oxygen Therapy: Patient Spontanous Breathing and Patient connected to supplemental oxygen ? ?Post-op Assessment: Post-op Vital signs reviewed, Patient's Cardiovascular Status Stable, Respiratory Function Stable, Patent Airway and No signs of Nausea or vomiting ? ?Post-op Vital Signs: Reviewed and stable ? ?Complications: No notable events documented. ? ?

## 2021-11-22 NOTE — Op Note (Signed)
PREOPERATIVE DIAGNOSIS:   mild stage Primary Open Angle Glaucoma right eye H40.1111 ? ?POSTOPERATIVE DIAGNOSIS:     mild stage Primary Open Angle Glaucoma right eye H40.1111 ? ?PROCEDURE:  OMNI canaloplasty and trabelculotomy right eye ?  ?SURGEON:  Deirdre Evener, MD ?  ?ANESTHESIA:  Topical with tetracaine drops augmented with 1% preservative-free intracameral lidocaine. ? ?  ?COMPLICATIONS:  None. ?  ?DESCRIPTION OF PROCEDURE:  The patient was identified in the holding room and transported to the operating room and placed in the supine position under the operating microscope.  The right eye was identified as the operative eye and it was prepped and draped in the usual sterile ophthalmic fashion. ?  ?A 1 millimeter clear-corneal paracentesis was made at the 12:00 position.  0.5 ml of preservative-free 1% lidocaine was injected into the anterior chamber. ? The anterior chamber was filled with Viscoat viscoelastic.  A 2.4 millimeter keratome was used to make a near-clear corneal incision at the 9:00  position.  ?  ?The microscope was adjusted and a gonioprism was used to visulaize the trabecular meshwork.  The Omni device was inserted and advanced toward the trabecular meshwork.  The cannula tip was used to gain access to Schlemm's canal.  The microcatheter was advanced into the superior angle for 180 degrees.  Upon retraction of the microcatheter, a controlled amount of Provisc was delivered into Schlemm's canal.  The cannula was withdrawn from the anterior chamber and additional Viscoat was placed into the eye.  The Omni was reinserted in the opposite direction to perform 180 degree canaloplasty of the inferior 180 degrees as noted above. ? ?After canaloplasty was completed, the microcatheter was advanced into the canal for 180 degrees in the inferior angle.  The catheter was used to unroof the canal by gently withdrawing it while exiting the eye.  ?The remaining viscoelastic was aspirated. ?  ?Wounds were  hydrated with balanced salt solution.  The anterior chamber was inflated to a physiologic pressure with balanced salt solution.  No wound leaks were noted. Cefuroxime 0.1 ml of a 10mg /ml solution was injected into the anterior chamber for a dose of 1 mg of intracameral antibiotic at the completion of the case. ? ? ?The patient was taken to the recovery room in stable condition without complications of anesthesia or surgery. ? ?

## 2021-11-22 NOTE — H&P (Signed)
[facility]  ? ?Primary Care Physician:  [facility] ?Ophthalmologist: Dr. Lockie Mola ? ?Pre-Procedure History & Physical: ?HPI:  Karl Tapia is a 76 y.o. male here for ophthalmic surgery. ?  ?Past Medical History:  ?Diagnosis Date  ? Acute blood loss anemia   ? Aphasia as late effect of cerebrovascular accident   ? CAD (coronary artery disease)   ? CHF (congestive heart failure) (HCC)   ? Gastroesophageal reflux disease   ? Hyperlipemia   ? Hypertension   ? Impotence of non-organic origin   ? MI (myocardial infarction) (HCC) 1996  ? Mixed anxiety depressive disorder   ? Pacemaker   ? Paroxysmal A-fib (HCC)   ? Presence of Watchman left atrial appendage closure device   ? Right sided weakness   ? from stroke (2020)  ? Sinus node dysfunction (HCC)   ? Stroke [facility])   ? 2009, 2020  ? Subdural hematoma   ? Wears hearing aid in both ears   ? ? ?Past Surgical History:  ?Procedure Laterality Date  ? CATARACT EXTRACTION    ? CATARACT EXTRACTION W/PHACO Left 06/28/2021  ? Procedure: CATARACT EXTRACTION PHACO AND INTRAOCULAR LENS PLACEMENT ([facility]) LEFT OMNI CANALOPLASTY & TRABECULECTOMY;  Surgeon: Lockie Mola, MD;  Location: [facility];  Service: Ophthalmology;  Laterality: Left;  leave last case ?10.92 ?01:03.0  ? CHOLECYSTECTOMY    ? HEMORROIDECTOMY    ? UPPER GI ENDOSCOPY    ? ? ?Prior to Admission medications   ?Medication Sig Start Date End Date Taking? Authorizing Provider  ?aspirin 81 MG tablet Take 81 mg by mouth daily.   Yes [provider]  ?atorvastatin (LIPITOR) 80 MG tablet Take 1 tablet by mouth daily. 01/24/21  Yes [provider]  ?Coenzyme Q10-Fish Oil-Vit E (CO-Q 10 OMEGA-3 FISH OIL PO) Take by mouth.   Yes [provider]  ?Docusate Calcium (STOOL SOFTENER LAXATIVE DC PO) Take by mouth.   Yes [provider]  ?FLUoxetine (PROZAC) 10 MG tablet Take 10 mg by mouth daily. 12/18/20  Yes [provider]   ?latanoprost (XALATAN) 0.005 % ophthalmic solution 1 drop at bedtime.   Yes [provider]  ?lisinopril (ZESTRIL) 10 MG tablet Take by mouth. 12/18/20  Yes [provider]  ?metoprolol succinate (TOPROL-XL) 100 MG 24 hr tablet Take 100 mg by mouth in the morning and at bedtime. Take with or immediately following a meal.   Yes [provider]  ?Multiple Vitamin (MULTIVITAMIN) tablet Take 1 tablet by mouth daily.   Yes [provider]  ? ? ?Allergies as of 08/15/2021 - Review Complete 06/28/2021  ?Allergen Reaction Noted  ? Erythromycin base  12/12/2015  ? Levofloxacin  12/12/2015  ? Lorazepam  12/12/2015  ? Oxycodone  12/12/2015  ? ? ?History reviewed. No pertinent family history. ? ?Social History  ? ?Socioeconomic History  ? Marital status: Married  ?  Spouse name: Not on file  ? Number of children: Not on file  ? Years of education: Not on file  ? Highest education level: Not on file  ?Occupational History  ? Not on file  ?Tobacco Use  ? Smoking status: Former  ?  Types: Cigarettes  ?  Quit date: 79  ?  Years since quitting: 40.2  ? Smokeless tobacco: Never  ?Vaping Use  ? Vaping Use: Never used  ?Substance and Sexual Activity  ? Alcohol use: No  ? Drug use: No  ? Sexual activity: Not on file  ?  Other Topics Concern  ? Not on file  ?Social History Narrative  ? Not on file  ? ?Social Determinants of Health  ? ?Financial Resource Strain: Not on file  ?Food Insecurity: Not on file  ?Transportation Needs: Not on file  ?Physical Activity: Not on file  ?Stress: Not on file  ?Social Connections: Not on file  ?Intimate Partner Violence: Not on file  ? ? ?Review of Systems: ?See HPI, otherwise negative ROS ? ?Physical Exam: ?BP 120/68   Pulse 66   Temp 97.7 ?F (36.5 ?C) (Temporal)   Resp 18   Ht 5\' 6"  (1.676 m)   Wt 80.1 kg   SpO2 99%   BMI 28.49 kg/m?  ?General:   Alert,  pleasant and cooperative in NAD ?Head:  Normocephalic and atraumatic. ?Lungs:  Clear to auscultation.     ?Heart:  Regular rate and rhythm.  ? ?Impression/Plan: ?Karl Tapia is here for ophthalmic surgery. ? ?Risks, benefits, limitations, and alternatives regarding ophthalmic surgery have been reviewed with the patient.  Questions have been answered.  All parties agreeable. ? ? Katha Hamming, MD  11/22/2021, 9:26 AM ? ? ?

## 2021-11-22 NOTE — Anesthesia Postprocedure Evaluation (Signed)
Anesthesia Post Note ? ?Patient: Karl Tapia ? ?Procedure(s) Performed: OMNI CANALOPLASTY / TRABECULOTOMY RIGHT (Right: Eye) ? ? ?  ?Patient location during evaluation: PACU ?Anesthesia Type: MAC ?Level of consciousness: awake and alert ?Pain management: pain level controlled ?Vital Signs Assessment: post-procedure vital signs reviewed and stable ?Respiratory status: spontaneous breathing, nonlabored ventilation, respiratory function stable and patient connected to nasal cannula oxygen ?Cardiovascular status: stable and blood pressure returned to baseline ?Postop Assessment: no apparent nausea or vomiting ?Anesthetic complications: no ? ? ?No notable events documented. ? ?Fidel Levy ? ? ? ? ? ?

## 2021-11-23 ENCOUNTER — Encounter: Payer: Self-pay | Admitting: Ophthalmology

## 2021-12-12 ENCOUNTER — Encounter: Payer: Self-pay | Admitting: Emergency Medicine

## 2021-12-12 ENCOUNTER — Other Ambulatory Visit: Payer: Self-pay

## 2021-12-12 ENCOUNTER — Ambulatory Visit
Admission: EM | Admit: 2021-12-12 | Discharge: 2021-12-12 | Disposition: A | Discharge status: Home / Self Care | Payer: Medicare Other | Attending: Physician Assistant | Admitting: Physician Assistant

## 2021-12-12 DIAGNOSIS — J019 Acute sinusitis, unspecified: Secondary | ICD-10-CM

## 2021-12-12 DIAGNOSIS — J209 Acute bronchitis, unspecified: Secondary | ICD-10-CM | POA: Diagnosis not present

## 2021-12-12 DIAGNOSIS — R051 Acute cough: Secondary | ICD-10-CM

## 2021-12-12 MED ORDER — IPRATROPIUM BROMIDE 0.06 % NA SOLN: 2.0000 | Freq: Four times a day (QID) | NASAL | 0 refills | Status: AC

## 2021-12-12 MED ORDER — DOXYCYCLINE HYCLATE 100 MG PO CAPS: 100.0000 mg | ORAL_CAPSULE | Freq: Two times a day (BID) | ORAL | 0 refills | Status: AC

## 2021-12-12 NOTE — Discharge Instructions (Signed)
-  You have a sinus infection and bronchitis.  I have sent an antibiotic to the pharmacy as well as a nasal spray.  Continue Mucinex and increase fluid intake. ?- Return if not feeling any better over the next week or if you develop a fever, worsening cough, trouble breathing or weakness. ?- Go to the ER for any severe acute changes such as sudden difficulty breathing ?

## 2021-12-12 NOTE — ED Triage Notes (Signed)
Pt c/o cough, nasal congestion. Started about 2 weeks ago. He was covid tested and negative. Pt states he has been worse over the last 2 days.  ?

## 2021-12-12 NOTE — ED Provider Notes (Signed)
?Karl Tapia ? ? ? ?CSN: [id number] ?Arrival date & time: 12/12/21  1021 ? ? ?  ? ?History   ?Chief Complaint ?Chief Complaint  ?Patient presents with  ? Cough  ? ? ?HPI ?Karl Tapia is a 76 y.o. male presenting for 2-week history of nasal congestion and cough.  Cough is productive of mostly clear sputum.  Nasal drainage is also mostly clear but sometimes yellowish.  Patient's wife says she thinks she heard him wheeze a time or 2.  He denies any shortness of breath or difficulty breathing.  No reported chest pain.  No fevers.  Patient reports sinus pressure but no pain.  No sore throat.  Patient has been taking Mucinex and says that has helped some.  He has had COVID test which have been negative.  History of hypertension, hyperlipidemia, previous MI and A-fib.  History of stroke in 2009 and 2020.  History of pacemaker and has appointment with cardiologist tomorrow.  No other complaints. ? ?HPI ? ?Past Medical History:  ?Diagnosis Date  ? Acute blood loss anemia   ? Aphasia as late effect of cerebrovascular accident   ? CAD (coronary artery disease)   ? CHF (congestive heart failure) ([place])   ? Gastroesophageal reflux disease   ? Hyperlipemia   ? Hypertension   ? Impotence of non-organic origin   ? MI (myocardial infarction) ([place]) 1996  ? Mixed anxiety depressive disorder   ? Pacemaker   ? Paroxysmal A-fib ([place])   ? Presence of Watchman left atrial appendage closure device   ? Right sided weakness   ? from stroke (2020)  ? Sinus node dysfunction (HCC)   ? Stroke [facility])   ? 2009, 2020  ? Subdural hematoma (HCC)   ? Wears hearing aid in both ears   ? ? ?There are no problems to display for this patient. ? ? ?Past Surgical History:  ?Procedure Laterality Date  ? CATARACT EXTRACTION    ? CATARACT EXTRACTION W/PHACO Left 06/28/2021  ? Procedure: CATARACT EXTRACTION PHACO AND INTRAOCULAR LENS PLACEMENT ([place]) LEFT OMNI CANALOPLASTY & TRABECULECTOMY;  Surgeon: Leandrew Koyanagi, MD;  Location:  [facility];  Service: Ophthalmology;  Laterality: Left;  leave last case ?10.92 ?01:03.0  ? CATARACT EXTRACTION W/PHACO Right 11/22/2021  ? Procedure: OMNI CANALOPLASTY / TRABECULOTOMY RIGHT;  Surgeon: Leandrew Koyanagi, MD;  Location: [facility];  Service: Ophthalmology;  Laterality: Right;  ? CHOLECYSTECTOMY    ? HEMORROIDECTOMY    ? UPPER GI ENDOSCOPY    ? ? ? ? ? ?Home Medications   ? ?Prior to Admission medications   ?Medication Sig Start Date End Date Taking? Authorizing Provider  ?aspirin 81 MG tablet Take 81 mg by mouth daily.   Yes [provider]  ?atorvastatin (LIPITOR) 80 MG tablet Take 1 tablet by mouth daily. 01/24/21  Yes [provider]  ?Coenzyme Q10-Fish Oil-Vit E (CO-Q 10 OMEGA-3 FISH OIL PO) Take by mouth.   Yes [provider]  ?Docusate Calcium (STOOL SOFTENER LAXATIVE DC PO) Take by mouth.   Yes [provider]  ?doxycycline (VIBRAMYCIN) 100 MG capsule Take 1 capsule (100 mg total) by mouth 2 (two) times daily for 7 days. 12/12/21 12/19/21 Yes Danton Clap, PA-C  ?FLUoxetine (PROZAC) 10 MG tablet Take 10 mg by mouth daily. 12/18/20  Yes [provider]  ?ipratropium (ATROVENT) 0.06 % nasal spray Place 2 sprays into both nostrils 4 (four) times daily. 12/12/21  Yes Laurene Footman B, PA-C  ?latanoprost (  XALATAN) 0.005 % ophthalmic solution 1 drop at bedtime.   Yes [provider]  ?lisinopril (ZESTRIL) 10 MG tablet Take by mouth. 12/18/20  Yes [provider]  ?metoprolol succinate (TOPROL-XL) 100 MG 24 hr tablet Take 100 mg by mouth in the morning and at bedtime. Take with or immediately following a meal.   Yes [provider]  ?Multiple Vitamin (MULTIVITAMIN) tablet Take 1 tablet by mouth daily.    [provider]  ? ? ?Family History ?No family history on file. ? ?Social History ?Social History  ? ?Tobacco Use  ? Smoking status: Former  ?  Types: Cigarettes  ?  Quit date: 54  ?  Years since  quitting: 40.3  ? Smokeless tobacco: Never  ?Vaping Use  ? Vaping Use: Never used  ?Substance Use Topics  ? Alcohol use: No  ? Drug use: No  ? ? ? ?Allergies   ?Erythromycin base, Levofloxacin, Lorazepam, and Oxycodone ? ? ?Review of Systems ?Review of Systems  ?Constitutional:  Negative for fatigue and fever.  ?HENT:  Positive for congestion, rhinorrhea and sinus pressure. Negative for sinus pain and sore throat.   ?Respiratory:  Positive for cough. Negative for shortness of breath.   ?Gastrointestinal:  Negative for abdominal pain, diarrhea, nausea and vomiting.  ?Musculoskeletal:  Negative for myalgias.  ?Neurological:  Negative for weakness, light-headedness and headaches.  ?Hematological:  Negative for adenopathy.  ? ? ?Physical Exam ?Triage Vital Signs ?ED Triage Vitals  ?Enc Vitals Group  ?   BP   ?   Pulse   ?   Resp   ?   Temp   ?   Temp src   ?   SpO2   ?   Weight   ?   Height   ?   Head Circumference   ?   Peak Flow   ?   Pain Score   ?   Pain Loc   ?   Pain Edu?   ?   Excl. in [place]?   ? ?No data found. ? ?Updated Vital Signs ?BP 121/71 (BP Location: Left Arm)   Pulse 63   Temp 98.1 ?F (36.7 ?C) (Oral)   Resp 18   Ht 5\' 6"  (1.676 m)   Wt 176 lb 9.4 oz (80.1 kg)   SpO2 100%   BMI 28.50 kg/m?  ? ? ?Physical Exam ?Vitals and nursing note reviewed.  ?Constitutional:   ?   General: He is not in acute distress. ?   Appearance: Normal appearance. He is well-developed. He is ill-appearing.  ?HENT:  ?   Head: Normocephalic and atraumatic.  ?   Right Ear: Tympanic membrane, ear canal and external ear normal.  ?   Left Ear: Tympanic membrane, ear canal and external ear normal.  ?   Nose: Congestion present.  ?   Mouth/Throat:  ?   Mouth: Mucous membranes are moist.  ?   Pharynx: Oropharynx is clear.  ?Eyes:  ?   Conjunctiva/sclera: Conjunctivae normal.  ?Cardiovascular:  ?   Rate and Rhythm: Normal rate and regular rhythm.  ?   Heart sounds: Normal heart sounds.  ?Pulmonary:  ?   Effort: Pulmonary effort is  normal. No respiratory distress.  ?   Breath sounds: Rhonchi (few scattered rhonchi throughout) present.  ?Musculoskeletal:  ?   Cervical back: Neck supple.  ?Skin: ?   General: Skin is warm and dry.  ?   Capillary Refill: Capillary refill takes less than 2  seconds.  ?Neurological:  ?   General: No focal deficit present.  ?   Mental Status: He is alert. Mental status is at baseline.  ?   Motor: No weakness.  ?   Coordination: Coordination normal.  ?   Gait: Gait normal.  ?Psychiatric:     ?   Mood and Affect: Mood normal.     ?   Behavior: Behavior normal.     ?   Thought Content: Thought content normal.  ? ? ? ?UC Treatments / Results  ?Labs ?(all labs ordered are listed, but only abnormal results are displayed) ?Labs Reviewed - No data to display ? ?EKG ? ? ?Radiology ?No results found. ? ?Procedures ?Procedures (including critical care time) ? ?Medications Ordered in UC ?Medications - No data to display ? ?Initial Impression / Assessment and Plan / UC Course  ?I have reviewed the triage vital signs and the nursing notes. ? ?Pertinent labs & imaging results that were available during my care of the patient were reviewed by me and considered in my medical decision making (see chart for details). ? ?76 year old male presenting with spouse for 2-week history of nasal congestion and cough.  Also sinus pressure and possible wheezing.  No fever or breathing difficulty. ? ?Vitals all stable.  Patient mildly ill-appearing but nontoxic.  On exam he does have nasal congestion and few scattered rhonchi throughout upper airways.  No wheezes, rales or respiratory distress. ? ?Advised patient and wife of symptoms are consistent with sinusitis and bronchitis.  Suspect viral versus allergies but given that he has been ill for 2 weeks and symptoms recently got worse, will cover him with an antibiotic.  Sent doxycycline to pharmacy as well as Atrovent nasal spray and advised to continue with the Mucinex and increasing fluids.   Return or go to ER for fever or breathing difficulty. ? ? ?Final Clinical Impressions(s) / UC Diagnoses  ? ?Final diagnoses:  ?Acute bronchitis, unspecified organism  ?Acute sinusitis, recurrence not specified, unspecified

## 2022-01-16 ENCOUNTER — Ambulatory Visit
Admission: EM | Admit: 2022-01-16 | Discharge: 2022-01-16 | Disposition: A | Discharge status: Home / Self Care | Payer: Medicare Other | Attending: Emergency Medicine | Admitting: Emergency Medicine

## 2022-01-16 DIAGNOSIS — B356 Tinea cruris: Secondary | ICD-10-CM

## 2022-01-16 MED ORDER — CLOTRIMAZOLE 1 % EX CREA
TOPICAL_CREAM | CUTANEOUS | 0 refills | Status: AC
Start: 2022-01-16 — End: ?

## 2022-01-16 NOTE — ED Triage Notes (Signed)
Pt c/o rash along groin x1week. ? ?Pt has taken OTC benadryl but it does not help.  ? ?Pt has had a stroke in 2009 and 2020.  ? ? ?

## 2022-01-16 NOTE — ED Triage Notes (Signed)
Pt rash is along the crease of the left inner thigh. ?

## 2022-01-16 NOTE — Discharge Instructions (Signed)
Apply the Clotrimazole twice daily to your rash until it is gone and then for 3 more days. ? ?Keep your skin dry. Wear shorts with no underwear to allow airflow so that the skin stays dry and helps to kill the fungus. ? ?Make sure you dry your skin completely after showers to prevent worsening of symptoms. ? ?Return if your symptoms worsen or see your PCP. ? ?For your hands apply an emollient lotion at bedtime and cover your hands with gloves so that the lotion is absorbed to make your skin more supple.  ?

## 2022-01-16 NOTE — ED Provider Notes (Signed)
?[facility] ? ? ? ?CSN: [id number] ?Arrival date & time: 01/16/22  1032 ? ? ?  ? ?History   ?Chief Complaint ?Chief Complaint  ?Patient presents with  ? Rash  ? ? ?HPI ?Karl Tapia is a 76 y.o. male.  ? ?HPI ? ?76 year old male here for evaluation of skin complaint. ? ?Patient is here with his wife for evaluation of a rash in the folds of the groin bilaterally that been present for at least a week.  There is a red, itchy, and having greasy coating on them.  Patient has not had any fever. ? ?Past Medical History:  ?Diagnosis Date  ? Acute blood loss anemia   ? Aphasia as late effect of cerebrovascular accident   ? CAD (coronary artery disease)   ? CHF (congestive heart failure) ([place])   ? Gastroesophageal reflux disease   ? Hyperlipemia   ? Hypertension   ? Impotence of non-organic origin   ? MI (myocardial infarction) ([place]) 1996  ? Mixed anxiety depressive disorder   ? Pacemaker   ? Paroxysmal A-fib ([place])   ? Presence of Watchman left atrial appendage closure device   ? Right sided weakness   ? from stroke (2020)  ? Sinus node dysfunction (HCC)   ? Stroke [facility])   ? 2009, 2020  ? Subdural hematoma (HCC)   ? Wears hearing aid in both ears   ? ? ?There are no problems to display for this patient. ? ? ?Past Surgical History:  ?Procedure Laterality Date  ? CATARACT EXTRACTION    ? CATARACT EXTRACTION W/PHACO Left 06/28/2021  ? Procedure: CATARACT EXTRACTION PHACO AND INTRAOCULAR LENS PLACEMENT ([place]) LEFT OMNI CANALOPLASTY & TRABECULECTOMY;  Surgeon: Leandrew Koyanagi, MD;  Location: [place];  Service: Ophthalmology;  Laterality: Left;  leave last case ?10.92 ?01:03.0  ? CATARACT EXTRACTION W/PHACO Right 11/22/2021  ? Procedure: OMNI CANALOPLASTY / TRABECULOTOMY RIGHT;  Surgeon: Leandrew Koyanagi, MD;  Location: [facility];  Service: Ophthalmology;  Laterality: Right;  ? CHOLECYSTECTOMY    ? HEMORROIDECTOMY    ? UPPER GI ENDOSCOPY    ? ? ? ? ? ?Home Medications   ? ?Prior  to Admission medications   ?Medication Sig Start Date End Date Taking? Authorizing Provider  ?aspirin 81 MG tablet Take 81 mg by mouth daily.   Yes [provider]  ?atorvastatin (LIPITOR) 80 MG tablet Take 1 tablet by mouth daily. 01/24/21  Yes [provider]  ?clotrimazole (LOTRIMIN) 1 % cream Apply to affected area 2 times daily 01/16/22  Yes Margarette Canada, NP  ?Coenzyme Q10-Fish Oil-Vit E (CO-Q 10 OMEGA-3 FISH OIL PO) Take by mouth.   Yes [provider]  ?Docusate Calcium (STOOL SOFTENER LAXATIVE DC PO) Take by mouth.   Yes [provider]  ?ipratropium (ATROVENT) 0.06 % nasal spray Place 2 sprays into both nostrils 4 (four) times daily. 12/12/21  Yes Laurene Footman B, PA-C  ?latanoprost (XALATAN) 0.005 % ophthalmic solution 1 drop at bedtime.   Yes [provider]  ?lisinopril (ZESTRIL) 10 MG tablet Take by mouth. 12/18/20  Yes [provider]  ?metoprolol succinate (TOPROL-XL) 100 MG 24 hr tablet Take 100 mg by mouth in the morning and at bedtime. Take with or immediately following a meal.   Yes [provider]  ?Multiple Vitamin (MULTIVITAMIN) tablet Take 1 tablet by mouth daily.   Yes [provider]  ? ? ?Family History ?History reviewed. No pertinent family history. ? ?Social History ?Social  History  ? ?Tobacco Use  ? Smoking status: Former  ?  Types: Cigarettes  ?  Quit date: 34  ?  Years since quitting: 40.3  ? Smokeless tobacco: Never  ?Vaping Use  ? Vaping Use: Never used  ?Substance Use Topics  ? Alcohol use: No  ? Drug use: No  ? ? ? ?Allergies   ?Erythromycin base, Levofloxacin, Lorazepam, and Oxycodone ? ? ?Review of Systems ?Review of Systems  ?Constitutional:  Negative for fever.  ?Skin:  Positive for color change and rash.  ?Hematological: Negative.   ?Psychiatric/Behavioral: Negative.    ? ? ?Physical Exam ?Triage Vital Signs ?ED Triage Vitals  ?Enc Vitals Group  ?   BP 01/16/22 1233 121/69  ?   Pulse Rate 01/16/22 1233 66  ?    Resp 01/16/22 1233 18  ?   Temp 01/16/22 1233 97.9 ?F (36.6 ?C)  ?   Temp Source 01/16/22 1233 Oral  ?   SpO2 01/16/22 1233 100 %  ?   Weight 01/16/22 1229 178 lb (80.7 kg)  ?   Height 01/16/22 1229 5\' 5"  (1.651 m)  ?   Head Circumference --   ?   Peak Flow --   ?   Pain Score 01/16/22 1229 0  ?   Pain Loc --   ?   Pain Edu? --   ?   Excl. in [place]? --   ? ?No data found. ? ?Updated Vital Signs ?BP 121/69 (BP Location: Left Arm)   Pulse 66   Temp 97.9 ?F (36.6 ?C) (Oral)   Resp 18   Ht 5\' 5"  (1.651 m)   Wt 178 lb (80.7 kg)   SpO2 100%   BMI 29.62 kg/m?  ? ?Visual Acuity ?Right Eye Distance:   ?Left Eye Distance:   ?Bilateral Distance:   ? ?Right Eye Near:   ?Left Eye Near:    ?Bilateral Near:    ? ?Physical Exam ?Vitals and nursing note reviewed.  ?Constitutional:   ?   Appearance: Normal appearance. He is not ill-appearing.  ?HENT:  ?   Head: Normocephalic and atraumatic.  ?Skin: ?   General: Skin is warm and dry.  ?   Capillary Refill: Capillary refill takes less than 2 seconds.  ?   Findings: Erythema and rash present.  ?Neurological:  ?   General: No focal deficit present.  ?   Mental Status: He is alert and oriented to person, place, and time.  ?Psychiatric:     ?   Mood and Affect: Mood normal.     ?   Behavior: Behavior normal.     ?   Thought Content: Thought content normal.     ?   Judgment: Judgment normal.  ? ? ? ?UC Treatments / Results  ?Labs ?(all labs ordered are listed, but only abnormal results are displayed) ?Labs Reviewed - No data to display ? ?EKG ? ? ?Radiology ?No results found. ? ?Procedures ?Procedures (including critical care time) ? ?Medications Ordered in UC ?Medications - No data to display ? ?Initial Impression / Assessment and Plan / UC Course  ?I have reviewed the triage vital signs and the nursing notes. ? ?Pertinent labs & imaging results that were available during my care of the patient were reviewed by me and considered in my medical decision making (see chart for  details). ? ?Patient is a pleasant, nontoxic-appearing 76 year old male with a history of CVA x2 who presents with his wife for evaluation of a  week plus worth of rash in bilateral inguinal folds.  The rash does not extend onto the scrotum or the perineum.  The rash on her erythematous, maculopapular plaques that are blanchable.  There is no scaly texture on top.  They do appear to be dry.  Patient's wife states that he has had recurrent tinea cruris throughout the years but this is worse than she seen in the past.  She has been trying to get the patient to wear just shorts and underwear around the house to leave the skin open to air but he has been resistant.  Patient exam is consistent with tinea cruris and I will place him on clotrimazole twice daily for treatment of same.  I have advised him to apply the ointment until the rash resolves then for 1 to 2 days afterwards.  I have also advised him to leave the areas open to air so that they can dry out and we can help fight the infection.  I have also suggested that he make sure that he is completely dry before he puts his clothes on after bathing. ? ? ?Final Clinical Impressions(s) / UC Diagnoses  ? ?Final diagnoses:  ?Tinea cruris  ? ? ? ?Discharge Instructions   ? ?  ?Apply the Clotrimazole twice daily to your rash until it is gone and then for 3 more days. ? ?Keep your skin dry. Wear shorts with no underwear to allow airflow so that the skin stays dry and helps to kill the fungus. ? ?Make sure you dry your skin completely after showers to prevent worsening of symptoms. ? ?Return if your symptoms worsen or see your PCP. ? ?For your hands apply an emollient lotion at bedtime and cover your hands with gloves so that the lotion is absorbed to make your skin more supple.  ? ? ? ? ?ED Prescriptions   ? ? Medication Sig Dispense Auth. Provider  ? clotrimazole (LOTRIMIN) 1 % cream Apply to affected area 2 times daily 45 g Margarette Canada, NP  ? ?  ? ?PDMP not reviewed this  encounter. ?  ?Margarette Canada, NP ?01/16/22 1312 ? ?

## 2023-01-14 ENCOUNTER — Ambulatory Visit
Admission: EM | Admit: 2023-01-14 | Discharge: 2023-01-14 | Disposition: A | Discharge status: Home / Self Care | Payer: Medicare Other | Attending: Emergency Medicine | Admitting: Emergency Medicine

## 2023-01-14 ENCOUNTER — Encounter: Payer: Self-pay | Admitting: Emergency Medicine

## 2023-01-14 ENCOUNTER — Ambulatory Visit (INDEPENDENT_AMBULATORY_CARE_PROVIDER_SITE_OTHER): Payer: Medicare Other

## 2023-01-14 DIAGNOSIS — M1711 Unilateral primary osteoarthritis, right knee: Secondary | ICD-10-CM | POA: Diagnosis not present

## 2023-01-14 NOTE — ED Triage Notes (Signed)
Pt presents with right leg pain for about 1 week. He only has pain with movement. Pt denies any injury.

## 2023-01-14 NOTE — Discharge Instructions (Addendum)
Your trays today show that you have significant arthritis in your right knee.  This is most likely what is causing your pain.  You may use over-the-counter Voltaren gel every 6 hours as needed for pain and inflammation.  I have given you a handout on arthritis and arthritis management that may be able to help you with your symptoms.  If your symptoms continue, or the pain worsens, I recommend following up with orthopedics for further evaluation and treatment options

## 2023-01-14 NOTE — ED Provider Notes (Signed)
[facility]    CSN: [id number] Arrival date & time: 01/14/23  0948      History   Chief Complaint Chief Complaint  Patient presents with   Leg Pain    HPI Karl Tapia is a 77 y.o. male.   HPI  77 year old male with a significant past medical history to include CVA, hypertension, atrial fibrillation, CHF, CAD, and MI presents for evaluation of pain in his right knee and thigh x 1 week.  No fall or injury.  Wife noticed that patient is walking with a limp.  The patient had right-sided deficits as a result of his CVA and would drag his right leg for a long time.  Wife reports that he is walking better but she noticed that his right knee has been swollen for the past week when the pain developed.  Past Medical History:  Diagnosis Date   Acute blood loss anemia    Aphasia as late effect of cerebrovascular accident    CAD (coronary artery disease)    CHF (congestive heart failure) (HCC)    Gastroesophageal reflux disease    Hyperlipemia    Hypertension    Impotence of non-organic origin    MI (myocardial infarction) (HCC) 1996   Mixed anxiety depressive disorder    Pacemaker    Paroxysmal A-fib (HCC)    Presence of Watchman left atrial appendage closure device    Right sided weakness    from stroke (2020)   Sinus node dysfunction (HCC)    Stroke (HCC)    2009, 2020   Subdural hematoma (HCC)    Wears hearing aid in both ears     There are no problems to display for this patient.   Past Surgical History:  Procedure Laterality Date   CATARACT EXTRACTION     CATARACT EXTRACTION W/PHACO Left 06/28/2021   Procedure: CATARACT EXTRACTION PHACO AND INTRAOCULAR LENS PLACEMENT (IOC) LEFT OMNI CANALOPLASTY & TRABECULECTOMY;  Surgeon: Lockie Mola, MD;  Location: [facility];  Service: Ophthalmology;  Laterality: Left;  leave last case 10.92 01:03.0   CATARACT EXTRACTION W/PHACO Right 11/22/2021   Procedure: OMNI CANALOPLASTY /  TRABECULOTOMY RIGHT;  Surgeon: Lockie Mola, MD;  Location: [facility];  Service: Ophthalmology;  Laterality: Right;   CHOLECYSTECTOMY     HEMORROIDECTOMY     UPPER GI ENDOSCOPY         Home Medications    Prior to Admission medications   Medication Sig Start Date End Date Taking? Authorizing Provider  atorvastatin (LIPITOR) 80 MG tablet Take 1 tablet by mouth daily. 01/24/21  Yes [provider]  lisinopril (ZESTRIL) 10 MG tablet Take by mouth. 12/18/20  Yes [provider]  metoprolol succinate (TOPROL-XL) 100 MG 24 hr tablet Take 100 mg by mouth in the morning and at bedtime. Take with or immediately following a meal.   Yes [provider]  aspirin 81 MG tablet Take 81 mg by mouth daily.    [provider]  clotrimazole (LOTRIMIN) 1 % cream Apply to affected area 2 times daily 01/16/22   Becky Augusta, NP  Coenzyme Q10-Fish Oil-Vit E (CO-Q 10 OMEGA-3 FISH OIL PO) Take by mouth.    [provider]  Docusate Calcium (STOOL SOFTENER LAXATIVE DC PO) Take by mouth.    [provider]  ipratropium (ATROVENT) 0.06 % nasal spray Place 2 sprays into both nostrils 4 (four) times daily. 12/12/21   Eusebio Friendly B, PA-C  latanoprost (XALATAN) 0.005 % ophthalmic solution  1 drop at bedtime.    [provider]  Multiple Vitamin (MULTIVITAMIN) tablet Take 1 tablet by mouth daily.    [provider]    Family History History reviewed. No pertinent family history.  Social History Social History   Tobacco Use   Smoking status: Former    Types: Cigarettes    Quit date: 1983    Years since quitting: 41.3   Smokeless tobacco: Never  Vaping Use   Vaping Use: Never used  Substance Use Topics   Alcohol use: No   Drug use: No     Allergies   Erythromycin base, Levofloxacin, Lorazepam, and Oxycodone   Review of Systems Review of Systems  Musculoskeletal:  Positive for arthralgias, gait problem, joint  swelling and myalgias.  Skin:  Negative for color change.     Physical Exam Triage Vital Signs ED Triage Vitals [01/14/23 1142]  Enc Vitals Group     BP      Pulse      Resp      Temp      Temp src      SpO2      Weight      Height      Head Circumference      Peak Flow      Pain Score 5     Pain Loc      Pain Edu?      Excl. in GC?    No data found.  Updated Vital Signs BP 130/78 (BP Location: Left Arm)   Pulse 61   Temp 97.7 F (36.5 C) (Oral)   Resp 16   SpO2 100%   Visual Acuity Right Eye Distance:   Left Eye Distance:   Bilateral Distance:    Right Eye Near:   Left Eye Near:    Bilateral Near:     Physical Exam Vitals and nursing note reviewed.  Constitutional:      Appearance: Normal appearance. He is not ill-appearing.  HENT:     Head: Normocephalic and atraumatic.  Musculoskeletal:        General: Swelling and tenderness present. No deformity or signs of injury. Normal range of motion.  Skin:    General: Skin is warm and dry.     Capillary Refill: Capillary refill takes less than 2 seconds.     Findings: No bruising or erythema.  Neurological:     General: No focal deficit present.     Mental Status: He is alert.      UC Treatments / Results  Labs (all labs ordered are listed, but only abnormal results are displayed) Labs Reviewed - No data to display  EKG   Radiology DG Knee Complete 4 Views Right  Result Date: 01/14/2023 CLINICAL DATA:  Pain, swelling EXAM: RIGHT KNEE - COMPLETE 4+ VIEW COMPARISON:  None available FINDINGS: Right knee moderate tricompartmental osteoarthritis with joint space loss, sclerosis and bony spurring. No acute osseous finding, fracture, malalignment, or large effusion. Peripheral atherosclerosis noted. No focal soft tissue abnormality. IMPRESSION: 1. Right knee tricompartmental osteoarthritis. 2. No acute finding by plain radiography. Electronically Signed   By: Judie Petit.  Shick M.D.   On: 01/14/2023 12:35     Procedures Procedures (including critical care time)  Medications Ordered in UC Medications - No data to display  Initial Impression / Assessment and Plan / UC Course  I have reviewed the triage vital signs and the nursing notes.  Pertinent labs & imaging results that were available during my  care of the patient were reviewed by me and considered in my medical decision making (see chart for details).   The patient is a pleasant 77 year old male presenting for evaluation of pain that he reports is on the distal aspect of his right inner thigh just above the knee.  On exam the patient does have mild swelling to the right knee but it is in normal anatomical alignment and free of erythema or ecchymosis.  He does have tenderness with palpation of the medial collateral ligament as well as palpation of the medial joint line and popliteal space.  Lateral joint line, patella, and tibial tuberosity are nontender to palpation.  No pain with varus and valgus stress application.  He does have some mild tenderness to the medial distal aspect of the quadriceps complex without any muscle tension or spasm.  The patient is ambulating with a limp and reports that the pain is only with movement and is not there when he is at rest.  Given the swelling of the patient's knee I suspect that this is actually the causative agent for the patient's pain.  Given his previous right-sided deficits this may be musculoskeletal in nature.  I will obtain a radiograph to rule out any presence of joint degeneration or deterioration which could be a cause of the patient's pain as well.  Radiology impression states right knee tricompartmental osteoarthritis.  No acute findings.  I will discharge patient home with a diagnosis of osteoarthritis of his right knee.  He can use topical Voltaren gel 4 times a day as needed for pain and inflammation.   Final Clinical Impressions(s) / UC Diagnoses   Final diagnoses:  Osteoarthritis of  right knee, unspecified osteoarthritis type     Discharge Instructions      Your trays today show that you have significant arthritis in your right knee.  This is most likely what is causing your pain.  You may use over-the-counter Voltaren gel every 6 hours as needed for pain and inflammation.  I have given you a handout on arthritis and arthritis management that may be able to help you with your symptoms.  If your symptoms continue, or the pain worsens, I recommend following up with orthopedics for further evaluation and treatment options     ED Prescriptions   None    PDMP not reviewed this encounter.   Becky Augusta, NP 01/14/23 1256

## 2023-11-24 ENCOUNTER — Ambulatory Visit
Admission: EM | Admit: 2023-11-24 | Discharge: 2023-11-24 | Disposition: A | Discharge status: Home / Self Care | Attending: Emergency Medicine | Admitting: Emergency Medicine

## 2023-11-24 ENCOUNTER — Encounter: Payer: Self-pay | Admitting: Emergency Medicine

## 2023-11-24 ENCOUNTER — Ambulatory Visit (INDEPENDENT_AMBULATORY_CARE_PROVIDER_SITE_OTHER)

## 2023-11-24 DIAGNOSIS — R0602 Shortness of breath: Secondary | ICD-10-CM | POA: Insufficient documentation

## 2023-11-24 DIAGNOSIS — Z8679 Personal history of other diseases of the circulatory system: Secondary | ICD-10-CM | POA: Diagnosis not present

## 2023-11-24 DIAGNOSIS — R051 Acute cough: Secondary | ICD-10-CM

## 2023-11-24 LAB — SARS CORONAVIRUS 2 BY RT PCR: SARS Coronavirus 2 by RT PCR: NEGATIVE

## 2023-11-24 MED ORDER — IPRATROPIUM-ALBUTEROL 20-100 MCG/ACT IN AERS: 1.0000 | INHALATION_SPRAY | Freq: Four times a day (QID) | RESPIRATORY_TRACT | 5 refills | Status: AC | PRN

## 2023-11-24 NOTE — ED Triage Notes (Signed)
 Pt cough, chest congestion, wheezing. Started about 4 days ago. Denies fever.

## 2023-11-24 NOTE — ED Provider Notes (Signed)
 [facility]    CSN: [id number] Arrival date & time: 11/24/23  4098      History   Chief Complaint Chief Complaint  Patient presents with   Cough   Wheezing    HPI Bodey Frizell is a 78 y.o. male.   Pleasant 78 year old male with a history of CHF and PAF presents today with complaint of cough and wheezing.  States he started having a mild sore throat a few days ago, and his wife was concerned because of the development of "a raspy sounding wheeze last evening.  He denies any weight gain, or edema.  He denies any orthopnea or PND.  He has not taken any medications for his symptoms.  He denies any fever, ear pain, sinus pressure.  Endorses some nasal congestion and postnasal drainage.  He does have a history of smoking, but denies any diagnosed chronic respiratory issues. His normal O2 is in the 98% range, He has a hx of smoking 3 PPD x 28 years.   Cough Associated symptoms: wheezing   Wheezing Associated symptoms: cough     Past Medical History:  Diagnosis Date   Acute blood loss anemia    Aphasia as late effect of cerebrovascular accident    CAD (coronary artery disease)    CHF (congestive heart failure) (HCC)    Gastroesophageal reflux disease    Hyperlipemia    Hypertension    Impotence of non-organic origin    MI (myocardial infarction) (HCC) 1996   Mixed anxiety depressive disorder    Pacemaker    Paroxysmal A-fib (HCC)    Presence of Watchman left atrial appendage closure device    Right sided weakness    from stroke (2020)   Sinus node dysfunction (HCC)    Stroke (HCC)    2009, 2020   Subdural hematoma (HCC)    Wears hearing aid in both ears     There are no active problems to display for this patient.   Past Surgical History:  Procedure Laterality Date   CATARACT EXTRACTION     CATARACT EXTRACTION W/PHACO Left 06/28/2021   Procedure: CATARACT EXTRACTION PHACO AND INTRAOCULAR LENS PLACEMENT (IOC) LEFT OMNI CANALOPLASTY &  TRABECULECTOMY;  Surgeon: Lockie Mola, MD;  Location: [facility];  Service: Ophthalmology;  Laterality: Left;  leave last case 10.92 01:03.0   CATARACT EXTRACTION W/PHACO Right 11/22/2021   Procedure: OMNI CANALOPLASTY / TRABECULOTOMY RIGHT;  Surgeon: Lockie Mola, MD;  Location: [facility];  Service: Ophthalmology;  Laterality: Right;   CHOLECYSTECTOMY     HEMORROIDECTOMY     UPPER GI ENDOSCOPY         Home Medications    Prior to Admission medications   Medication Sig Start Date End Date Taking? Authorizing Provider  Ipratropium-Albuterol (COMBIVENT) 20-100 MCG/ACT AERS respimat Inhale 1 puff into the lungs every 6 (six) hours as needed for wheezing or shortness of breath. 11/24/23  Yes Jayme Cham L, PA  aspirin 81 MG tablet Take 81 mg by mouth daily.    [provider]  atorvastatin (LIPITOR) 80 MG tablet Take 1 tablet by mouth daily. 01/24/21   [provider]  clotrimazole (LOTRIMIN) 1 % cream Apply to affected area 2 times daily 01/16/22   Becky Augusta, NP  Coenzyme Q10-Fish Oil-Vit E (CO-Q 10 OMEGA-3 FISH OIL PO) Take by mouth.    [provider]  Docusate Calcium (STOOL SOFTENER LAXATIVE DC PO) Take by mouth.    [provider]  ipratropium (ATROVENT)  0.06 % nasal spray Place 2 sprays into both nostrils 4 (four) times daily. 12/12/21   Eusebio Friendly B, PA-C  latanoprost (XALATAN) 0.005 % ophthalmic solution 1 drop at bedtime.    [provider]  lisinopril (ZESTRIL) 10 MG tablet Take by mouth. 12/18/20   [provider]  metoprolol succinate (TOPROL-XL) 100 MG 24 hr tablet Take 100 mg by mouth in the morning and at bedtime. Take with or immediately following a meal.    [provider]  Multiple Vitamin (MULTIVITAMIN) tablet Take 1 tablet by mouth daily.    [provider]    Family History History reviewed. No pertinent family history.  Social History Social History    Tobacco Use   Smoking status: Former    Current packs/day: 0.00    Types: Cigarettes    Quit date: 1983    Years since quitting: 42.2   Smokeless tobacco: Never  Vaping Use   Vaping status: Never Used  Substance Use Topics   Alcohol use: No   Drug use: No     Allergies   Erythromycin base, Levofloxacin, Lorazepam, and Oxycodone   Review of Systems Review of Systems  Respiratory:  Positive for cough and wheezing.      Physical Exam Triage Vital Signs ED Triage Vitals  Encounter Vitals Group     BP 11/24/23 1039 113/76     Systolic BP Percentile --      Diastolic BP Percentile --      Pulse Rate 11/24/23 1039 78     Resp 11/24/23 1039 18     Temp 11/24/23 1039 97.7 F (36.5 C)     Temp Source 11/24/23 1039 Oral     SpO2 11/24/23 1039 95 %     Weight 11/24/23 1037 177 lb 14.6 oz (80.7 kg)     Height 11/24/23 1037 5\' 5"  (1.651 m)     Head Circumference --      Peak Flow --      Pain Score 11/24/23 1036 0     Pain Loc --      Pain Education --      Exclude from Growth Chart --    No data found.  Updated Vital Signs BP 113/76 (BP Location: Right Arm)   Pulse 78   Temp 97.7 F (36.5 C) (Oral)   Resp 18   Ht 5\' 5"  (1.651 m)   Wt 177 lb 14.6 oz (80.7 kg)   SpO2 95%   BMI 29.61 kg/m   Visual Acuity Right Eye Distance:   Left Eye Distance:   Bilateral Distance:    Right Eye Near:   Left Eye Near:    Bilateral Near:     Physical Exam Vitals and nursing note reviewed. Exam conducted with a chaperone present.  Constitutional:      General: He is not in acute distress.    Appearance: Normal appearance. He is not ill-appearing, toxic-appearing or diaphoretic.  HENT:     Head: Normocephalic and atraumatic.     Right Ear: Tympanic membrane, ear canal and external ear normal. There is no impacted cerumen.     Left Ear: Tympanic membrane, ear canal and external ear normal. There is no impacted cerumen.     Nose: Congestion present.     Mouth/Throat:      Mouth: Mucous membranes are moist.     Pharynx: Oropharynx is clear. No oropharyngeal exudate or posterior oropharyngeal erythema.  Eyes:     General: No scleral  icterus.       Right eye: No discharge.        Left eye: No discharge.     Extraocular Movements: Extraocular movements intact.     Pupils: Pupils are equal, round, and reactive to light.  Neck:     Thyroid: No thyroid mass, thyromegaly or thyroid tenderness.  Cardiovascular:     Rate and Rhythm: Normal rate and regular rhythm.     Pulses: Normal pulses.     Heart sounds: No murmur heard. Pulmonary:     Effort: Pulmonary effort is normal. No respiratory distress.     Breath sounds: No stridor. Wheezing (mild posterior wheezing noted) present. No rhonchi.     Comments: Possible faint crackles noted bibasilar posterior lung fields. Abdominal:     General: Abdomen is flat. Bowel sounds are normal. There is no distension.     Palpations: Abdomen is soft. There is no mass.     Tenderness: There is no abdominal tenderness. There is no guarding.  Musculoskeletal:     Cervical back: Normal range of motion and neck supple. No rigidity or tenderness.     Right lower leg: No edema.     Left lower leg: No edema.  Lymphadenopathy:     Cervical: No cervical adenopathy.  Skin:    General: Skin is warm and dry.     Coloration: Skin is not jaundiced.     Findings: No bruising, erythema or rash.  Neurological:     General: No focal deficit present.     Mental Status: He is alert and oriented to person, place, and time.     Sensory: No sensory deficit.     Motor: No weakness.  Psychiatric:        Mood and Affect: Mood normal.        Behavior: Behavior normal.      UC Treatments / Results  Labs (all labs ordered are listed, but only abnormal results are displayed) Labs Reviewed  SARS CORONAVIRUS 2 BY RT PCR    EKG   Radiology DG Chest 2 View Result Date: 11/24/2023 CLINICAL DATA:  Cough, wheezing and shortness of breath.  EXAM: CHEST - 2 VIEW COMPARISON:  12/12/2015. FINDINGS: Trachea is midline. Heart size within normal limits. Pacemaker lead tips are in the right atrium and right ventricle. Left atrial occlusion device. Lungs are clear. No pleural fluid. IMPRESSION: No acute findings. Electronically Signed   By: Leanna Battles M.D.   On: 11/24/2023 11:16    Procedures Procedures (including critical care time)  Medications Ordered in UC Medications - No data to display  Initial Impression / Assessment and Plan / UC Course  I have reviewed the triage vital signs and the nursing notes.  Pertinent labs & imaging results that were available during my care of the patient were reviewed by me and considered in my medical decision making (see chart for details).     Acute cough -COVID test is pending, vital signs are stable although O2 is slightly lower than patient's baseline.  I do highly suspect that patient may have an undiagnosed chronic bronchitis.  Chest x-ray was negative for pulmonary edema or pleural fluid.  I do not feel that this is cardiac in nature.  He is having some URI symptoms and thus nasal drainage may be the cause.  Recommended over-the-counter Mucinex, saline spray, and adequate hydration. Shortness of breath -will do a trial of Combivent Respimat to help with his current symptoms in the setting of  possible undiagnosed chronic bronchitis.  Recommended he call his primary care tomorrow to schedule a 1 week follow-up. History of CHF -chest x-ray is negative for pulmonary edema or pleural effusion.  He is denying any red flag signs or symptoms.   Final Clinical Impressions(s) / UC Diagnoses   Final diagnoses:  Acute cough  Shortness of breath  History of chronic CHF     Discharge Instructions      Your chest xray is normal.  Please start using the combivent respimat inhaler called in today. Use this every 6-8 hours as needed for your symptoms. Please call your PCP tomorrow to schedule  a follow up appointment to monitor your response to this therapy. Please also take mucinex 600mg  over the counter twice daily.     ED Prescriptions     Medication Sig Dispense Auth. Provider   Ipratropium-Albuterol (COMBIVENT) 20-100 MCG/ACT AERS respimat Inhale 1 puff into the lungs every 6 (six) hours as needed for wheezing or shortness of breath. 1 each Maretta Bees, PA      PDMP not reviewed this encounter.   Marlen, [place], [place] 11/24/23 1138

## 2023-11-24 NOTE — Discharge Instructions (Addendum)
 Your chest xray is normal.  Please start using the combivent respimat inhaler called in today. Use this every 6-8 hours as needed for your symptoms. Please call your PCP tomorrow to schedule a follow up appointment to monitor your response to this therapy. Please also take mucinex 600mg  over the counter twice daily.
